# Patient Record
Sex: Female | Born: 1955 | Race: White | Hispanic: No | Marital: Married
Health system: Southern US, Community
[De-identification: ages and names within clinical notes are randomized; demographics above are authoritative.]

## PROBLEM LIST (undated history)

## (undated) DIAGNOSIS — T7840XA Allergy, unspecified, initial encounter: Secondary | ICD-10-CM

## (undated) DIAGNOSIS — F32A Depression, unspecified: Secondary | ICD-10-CM

## (undated) DIAGNOSIS — R011 Cardiac murmur, unspecified: Secondary | ICD-10-CM

## (undated) DIAGNOSIS — G709 Myoneural disorder, unspecified: Secondary | ICD-10-CM

## (undated) DIAGNOSIS — H269 Unspecified cataract: Secondary | ICD-10-CM

## (undated) DIAGNOSIS — F419 Anxiety disorder, unspecified: Secondary | ICD-10-CM

## (undated) DIAGNOSIS — I639 Cerebral infarction, unspecified: Secondary | ICD-10-CM

## (undated) DIAGNOSIS — G4730 Sleep apnea, unspecified: Secondary | ICD-10-CM

## (undated) DIAGNOSIS — E079 Disorder of thyroid, unspecified: Secondary | ICD-10-CM

## (undated) DIAGNOSIS — I10 Essential (primary) hypertension: Secondary | ICD-10-CM

## (undated) DIAGNOSIS — K219 Gastro-esophageal reflux disease without esophagitis: Secondary | ICD-10-CM

## (undated) HISTORY — DX: Essential (primary) hypertension: I10

## (undated) HISTORY — PX: LAMINECTOMY: SHX219

## (undated) HISTORY — DX: Unspecified cataract: H26.9

## (undated) HISTORY — PX: SPINE SURGERY: SHX786

## (undated) HISTORY — PX: EYE SURGERY: SHX253

## (undated) HISTORY — DX: Cardiac murmur, unspecified: R01.1

## (undated) HISTORY — DX: Cerebral infarction, unspecified: I63.9

## (undated) HISTORY — DX: Depression, unspecified: F32.A

## (undated) HISTORY — DX: Allergy, unspecified, initial encounter: T78.40XA

## (undated) HISTORY — DX: Gastro-esophageal reflux disease without esophagitis: K21.9

## (undated) HISTORY — DX: Anxiety disorder, unspecified: F41.9

## (undated) HISTORY — DX: Disorder of thyroid, unspecified: E07.9

## (undated) HISTORY — DX: Sleep apnea, unspecified: G47.30

## (undated) HISTORY — DX: Myoneural disorder, unspecified: G70.9

## (undated) HISTORY — PX: BREAST SURGERY: SHX581

## (undated) HISTORY — PX: TUBAL LIGATION: SHX77

---

## 1999-01-28 ENCOUNTER — Encounter: Admission: RE | Admit: 1999-01-28 | Discharge: 1999-01-28 | Payer: Self-pay | Admitting: Family Medicine

## 1999-01-28 ENCOUNTER — Encounter: Payer: Self-pay | Admitting: Family Medicine

## 1999-02-18 ENCOUNTER — Ambulatory Visit (HOSPITAL_BASED_OUTPATIENT_CLINIC_OR_DEPARTMENT_OTHER): Admission: RE | Admit: 1999-02-18 | Discharge: 1999-02-18 | Payer: Self-pay | Admitting: Surgery

## 1999-02-18 ENCOUNTER — Encounter (INDEPENDENT_AMBULATORY_CARE_PROVIDER_SITE_OTHER): Payer: Self-pay | Admitting: Specialist

## 1999-04-12 ENCOUNTER — Other Ambulatory Visit: Admission: RE | Admit: 1999-04-12 | Discharge: 1999-04-12 | Payer: Self-pay | Admitting: Obstetrics and Gynecology

## 2000-01-30 ENCOUNTER — Encounter: Payer: Self-pay | Admitting: Family Medicine

## 2000-01-30 ENCOUNTER — Encounter: Admission: RE | Admit: 2000-01-30 | Discharge: 2000-01-30 | Payer: Self-pay | Admitting: Family Medicine

## 2000-04-14 ENCOUNTER — Other Ambulatory Visit: Admission: RE | Admit: 2000-04-14 | Discharge: 2000-04-14 | Payer: Self-pay | Admitting: *Deleted

## 2001-06-23 ENCOUNTER — Other Ambulatory Visit: Admission: RE | Admit: 2001-06-23 | Discharge: 2001-06-23 | Payer: Self-pay | Admitting: Obstetrics and Gynecology

## 2001-07-16 ENCOUNTER — Ambulatory Visit (HOSPITAL_COMMUNITY): Admission: RE | Admit: 2001-07-16 | Discharge: 2001-07-16 | Payer: Self-pay | Admitting: *Deleted

## 2002-07-12 ENCOUNTER — Other Ambulatory Visit: Admission: RE | Admit: 2002-07-12 | Discharge: 2002-07-12 | Payer: Self-pay | Admitting: Obstetrics and Gynecology

## 2002-11-23 ENCOUNTER — Other Ambulatory Visit: Admission: RE | Admit: 2002-11-23 | Discharge: 2002-11-23 | Payer: Self-pay | Admitting: Obstetrics & Gynecology

## 2002-12-05 ENCOUNTER — Encounter: Admission: RE | Admit: 2002-12-05 | Discharge: 2002-12-05 | Payer: Self-pay | Admitting: Family Medicine

## 2003-08-29 ENCOUNTER — Encounter: Admission: RE | Admit: 2003-08-29 | Discharge: 2003-08-29 | Payer: Self-pay | Admitting: Obstetrics and Gynecology

## 2004-01-09 ENCOUNTER — Ambulatory Visit: Payer: Self-pay | Admitting: Internal Medicine

## 2004-03-05 ENCOUNTER — Ambulatory Visit: Payer: Self-pay | Admitting: Internal Medicine

## 2005-02-26 ENCOUNTER — Encounter: Admission: RE | Admit: 2005-02-26 | Discharge: 2005-02-26 | Payer: Self-pay | Admitting: Family Medicine

## 2005-03-13 ENCOUNTER — Encounter: Admission: RE | Admit: 2005-03-13 | Discharge: 2005-03-13 | Payer: Self-pay | Admitting: Family Medicine

## 2006-06-01 ENCOUNTER — Encounter: Admission: RE | Admit: 2006-06-01 | Discharge: 2006-06-01 | Payer: Self-pay | Admitting: Family Medicine

## 2007-06-04 ENCOUNTER — Encounter: Admission: RE | Admit: 2007-06-04 | Discharge: 2007-06-04 | Payer: Self-pay | Admitting: Family Medicine

## 2008-07-11 ENCOUNTER — Encounter: Admission: RE | Admit: 2008-07-11 | Discharge: 2008-07-11 | Payer: Self-pay | Admitting: Family Medicine

## 2009-07-17 ENCOUNTER — Encounter: Admission: RE | Admit: 2009-07-17 | Discharge: 2009-07-17 | Payer: Self-pay | Admitting: Family Medicine

## 2009-07-31 ENCOUNTER — Encounter: Admission: RE | Admit: 2009-07-31 | Discharge: 2009-07-31 | Payer: Self-pay | Admitting: Family Medicine

## 2010-06-07 NOTE — Op Note (Signed)
[facility]  Patient:    Dorothy Ray, Dorothy Ray Visit Number: [id number] MRN: [id number]          Service Type: DSU Location: [facility] Attending Physician:  Ermalene Searing Dictated by:   Marina Gravel, M.D. Proc. Date: 07/16/01 Admit Date:  07/16/2001 Discharge Date: 07/16/2001                             Operative Report  PREOPERATIVE DIAGNOSIS:       Undesired fertility.  POSTOPERATIVE DIAGNOSIS:      Undesired fertility.  PROCEDURE:                    Essure tubal sterilization.  SURGEON:                      Marina Gravel, M.D.  ANESTHESIA:                   MAC and 10 cc 0.5% Marcaine paracervical block.  FINDINGS:                     Normal-appearing endometrium.  Normal-appering tubal ostia bilaterally.  After placement, four coils were visual in the cavity on the left and the right side.  FLUID DEFICIT:                40 cc lactated Ringers.  COMPLICATIONS:                None.  ESTIMATED BLOOD LOSS:         Minimal.  INDICATIONS:                  The patient desires permanent sterilization. She has discussed various options and has made the decision to proceed with the Essure tubal sterilization procedure.  The patient has been made aware of the fact that she needs to continue oral contraceptive pills for the following three months until she has her three-month postoperative HST, which is mandated by the FDA to confirm tubal obstruction.  DESCRIPTION OF PROCEDURE:     The patient was taken to the operating room and MAC anesthesia obtained.  The patient was placed in the skin position and examined under anesthesia.  She was found to have a retroverted uterus of normal size.  No adnexal masses or tenderness.  She was prepped and draped in the standard fashion and the bladder was emptied with a red rubber catheter.  A speculum was inserted and paracervical block placed in standard form.  A single-tooth tenaculum was placed on the anterior  lip of the cervix and a 5 mm hysteroscope was inserted after being flushed with LR.  The cavity was distended and both tubal ostia were visualized without difficulty.  The first approach was on the left tube.  It was visualized and the Essure devise passed into the tubal ostia to the appropriate depth over the guidewire.  The device was then released and uncoiled in standard fashion.  Four coils were noted in the cavity after placement.  This procedure was repeated on the opposite side in the exact same fashion.  Four coils were also noted on the right side. This is considered optimal placement.  The instruments were removed.  The cervix was hemostatic.  The patient tolerated the procedure well.  There were no complications.  She was taken to the recovery room awake, alert and in stable condition.  Dictated by:   Marina Gravel, M.D. Attending Physician:  Marina Gravel B DD:  07/16/01 TD:  07/18/01 Job: [id number]

## 2010-06-07 NOTE — H&P (Signed)
[facility]  Patient:    Dorothy Ray, Dorothy Ray Visit Number: [id number] MRN: [id number]          Service Type: Attending:  Marina Gravel, M.D. Dictated by:   Marina Gravel, M.D.                           History and Physical  PREOPERATIVE DIAGNOSIS:       Undesired fertility.  HISTORY OF PRESENT ILLNESS:   A 55 year old white female gravida 2 para 2 who presents with desire for permanent tubal sterilization.  Has considered the various options and wishes to proceed with the Essure tubal sterilization procedure.  She has been made aware of the fact that there is a 10% chance of inability to visualize both tubal ostia, the mandated three-month postoperative HSG, and the need for continuing contraception in the meantime.  PAST MEDICAL HISTORY:         None.  PAST SURGICAL HISTORY:        Left breast mass and L6 laminectomy.  MEDICATIONS:                  Ambien and Xanax p.r.n.  ALLERGIES:                    None.  SOCIAL HISTORY:               One-and-a-half pack a day smoker.  No alcohol or other drugs.  FAMILY HISTORY:               Lung cancer, prostate cancer, and bone cancer in her father, and mother and father with hypertension.  REVIEW OF SYSTEMS:            Otherwise noncontributory.  PHYSICAL EXAMINATION:  VITAL SIGNS:                  Blood pressure 146/88, weight 146, height 5 feet 6 inches.  GENERAL:                      Alert and oriented, no acute distress.  HEART:                        Regular rate and rhythm.  LUNGS:                        Clear to auscultation.  ABDOMEN:                      Liver and spleen normal, no hernia.  PELVIC:                       Normal external genitalia, vagina and cervix normal.  ASSESSMENT:                   Desire for permanent tubal sterilization with the Essure procedure.  Operative risks discussed including infection; bleeding; uterine perforation; damage to bower, bladder, or  surrounding organs; and the other considerations as outlined above.  All questions answered; the patient wishes to proceed. Dictated by:   Marina Gravel, M.D. Attending:  Marina Gravel, M.D. DD:  07/14/01 TD:  07/14/01 Job: [id number]

## 2010-06-07 NOTE — Op Note (Signed)
[place]  Patient:    Dorothy Ray                        MRN: [id number] Proc. Date: 02/18/99 Adm. Date:  [id number] Attending:  Abigail Miyamoto A                           Operative Report  PREOPERATIVE DIAGNOSIS:  Left breast mass.  POSTOPERATIVE DIAGNOSIS:  Left breast mass.  PROCEDURE:  Excision of left breast mass.  SURGEON:  Abigail Miyamoto, M.D.  ANESTHESIA:  Local 1% lidocaine and monitored anesthesia care.  ESTIMATED BLOOD LOSS:  Minimal.  DESCRIPTION OF PROCEDURE:  The patient was brought to the operating room and identified as Dorothy Ray.  She was placed supine on the operating room table, then anesthesia was induced.  The skin overlying the palpable mass was then anesthetized with 1% lidocaine.  A small incision was made in the left breast, overlying the mass with a #15 blade scalpel.  This incision was carried down to the breast tissue with the electrocautery.  The mass was then identified and grasped with an Allis clamp, and then completely excised with the electrocautery.  The ass was then sent to pathology for identification.  The wound was then irrigated with normal saline.  Hemostasis was then achieved with the electrocautery.  The subcutaneous layer was then closed with a single interrupted #3-0 Vicryl suture, and the skin was closed with a running #4-0 monocryl.  Steri-Strips, gauze, and  Tegaderm were then applied.  The patient tolerated the procedure well.  All sponge, needle, and instrument counts were correct at the end of the procedure.  The patient was then taken in  stable condition from the operating room to the recovery room. DD:  02/18/99 TD:  02/18/99 Job: [id number]

## 2011-01-30 ENCOUNTER — Other Ambulatory Visit: Payer: Self-pay | Admitting: Family Medicine

## 2011-01-30 DIAGNOSIS — Z1231 Encounter for screening mammogram for malignant neoplasm of breast: Secondary | ICD-10-CM

## 2011-02-14 ENCOUNTER — Ambulatory Visit: Payer: Self-pay

## 2011-02-25 ENCOUNTER — Inpatient Hospital Stay: Admission: RE | Admit: 2011-02-25 | Payer: Self-pay | Source: Ambulatory Visit

## 2011-05-29 ENCOUNTER — Ambulatory Visit: Payer: Self-pay

## 2011-06-03 ENCOUNTER — Ambulatory Visit
Admission: RE | Admit: 2011-06-03 | Discharge: 2011-06-03 | Disposition: A | Discharge status: Home / Self Care | Payer: Commercial Indemnity | Source: Ambulatory Visit | Attending: Family Medicine | Admitting: Family Medicine

## 2011-06-03 DIAGNOSIS — Z1231 Encounter for screening mammogram for malignant neoplasm of breast: Secondary | ICD-10-CM

## 2011-06-05 ENCOUNTER — Other Ambulatory Visit: Payer: Self-pay | Admitting: Family Medicine

## 2011-06-05 DIAGNOSIS — R928 Other abnormal and inconclusive findings on diagnostic imaging of breast: Secondary | ICD-10-CM

## 2011-06-26 ENCOUNTER — Other Ambulatory Visit: Payer: Commercial Indemnity

## 2011-07-03 ENCOUNTER — Ambulatory Visit
Admission: RE | Admit: 2011-07-03 | Discharge: 2011-07-03 | Disposition: A | Discharge status: Home / Self Care | Payer: Commercial Indemnity | Source: Ambulatory Visit | Attending: Family Medicine | Admitting: Family Medicine

## 2011-07-03 ENCOUNTER — Other Ambulatory Visit: Payer: Self-pay | Admitting: Family Medicine

## 2011-07-03 DIAGNOSIS — R928 Other abnormal and inconclusive findings on diagnostic imaging of breast: Secondary | ICD-10-CM

## 2011-07-03 DIAGNOSIS — N6320 Unspecified lump in the left breast, unspecified quadrant: Secondary | ICD-10-CM

## 2011-07-10 ENCOUNTER — Other Ambulatory Visit: Payer: Commercial Indemnity

## 2011-11-24 ENCOUNTER — Other Ambulatory Visit: Payer: Self-pay | Admitting: Family Medicine

## 2011-11-24 DIAGNOSIS — N6009 Solitary cyst of unspecified breast: Secondary | ICD-10-CM

## 2013-01-23 DIAGNOSIS — I6350 Cerebral infarction due to unspecified occlusion or stenosis of unspecified cerebral artery: Secondary | ICD-10-CM | POA: Insufficient documentation

## 2013-02-04 DIAGNOSIS — H532 Diplopia: Secondary | ICD-10-CM | POA: Insufficient documentation

## 2013-09-06 ENCOUNTER — Other Ambulatory Visit: Payer: Self-pay | Admitting: Family Medicine

## 2013-09-06 DIAGNOSIS — N6002 Solitary cyst of left breast: Secondary | ICD-10-CM

## 2013-10-24 ENCOUNTER — Other Ambulatory Visit: Payer: Self-pay | Admitting: Family Medicine

## 2013-10-24 DIAGNOSIS — N6002 Solitary cyst of left breast: Secondary | ICD-10-CM

## 2013-11-01 ENCOUNTER — Ambulatory Visit
Admission: RE | Admit: 2013-11-01 | Discharge: 2013-11-01 | Disposition: A | Discharge status: Home / Self Care | Payer: Commercial Indemnity | Source: Ambulatory Visit | Attending: Family Medicine | Admitting: Family Medicine

## 2013-11-01 ENCOUNTER — Other Ambulatory Visit: Payer: Self-pay | Admitting: Family Medicine

## 2013-11-01 DIAGNOSIS — N6002 Solitary cyst of left breast: Secondary | ICD-10-CM

## 2013-11-23 ENCOUNTER — Other Ambulatory Visit: Payer: Self-pay | Admitting: Family Medicine

## 2013-11-23 DIAGNOSIS — Z803 Family history of malignant neoplasm of breast: Secondary | ICD-10-CM

## 2013-12-08 ENCOUNTER — Ambulatory Visit
Admission: RE | Admit: 2013-12-08 | Discharge: 2013-12-08 | Disposition: A | Discharge status: Home / Self Care | Payer: Commercial Indemnity | Source: Ambulatory Visit | Attending: Family Medicine | Admitting: Family Medicine

## 2013-12-08 DIAGNOSIS — Z803 Family history of malignant neoplasm of breast: Secondary | ICD-10-CM

## 2013-12-08 MED ORDER — GADOBENATE DIMEGLUMINE 529 MG/ML IV SOLN
20.0000 mL | Freq: Once | INTRAVENOUS | Status: AC | PRN
  Administered 2013-12-08: 20 mL via INTRAVENOUS

## 2013-12-29 DIAGNOSIS — E538 Deficiency of other specified B group vitamins: Secondary | ICD-10-CM | POA: Insufficient documentation

## 2015-01-31 DIAGNOSIS — R5383 Other fatigue: Secondary | ICD-10-CM | POA: Insufficient documentation

## 2015-01-31 DIAGNOSIS — Z0000 Encounter for general adult medical examination without abnormal findings: Secondary | ICD-10-CM | POA: Insufficient documentation

## 2015-01-31 DIAGNOSIS — Z87891 Personal history of nicotine dependence: Secondary | ICD-10-CM | POA: Insufficient documentation

## 2015-01-31 DIAGNOSIS — N951 Menopausal and female climacteric states: Secondary | ICD-10-CM | POA: Insufficient documentation

## 2015-03-19 ENCOUNTER — Other Ambulatory Visit: Payer: Self-pay

## 2015-03-19 DIAGNOSIS — Z1231 Encounter for screening mammogram for malignant neoplasm of breast: Secondary | ICD-10-CM

## 2015-04-05 ENCOUNTER — Ambulatory Visit
Admission: RE | Admit: 2015-04-05 | Discharge: 2015-04-05 | Disposition: A | Discharge status: Home / Self Care | Payer: Commercial Indemnity | Source: Ambulatory Visit

## 2015-04-05 DIAGNOSIS — Z1231 Encounter for screening mammogram for malignant neoplasm of breast: Secondary | ICD-10-CM

## 2015-04-09 ENCOUNTER — Other Ambulatory Visit: Payer: Self-pay | Admitting: Family Medicine

## 2015-04-09 DIAGNOSIS — R928 Other abnormal and inconclusive findings on diagnostic imaging of breast: Secondary | ICD-10-CM

## 2015-04-25 ENCOUNTER — Other Ambulatory Visit: Payer: Self-pay | Admitting: Family Medicine

## 2015-04-25 DIAGNOSIS — R928 Other abnormal and inconclusive findings on diagnostic imaging of breast: Secondary | ICD-10-CM

## 2015-05-17 ENCOUNTER — Other Ambulatory Visit: Payer: Commercial Indemnity

## 2015-06-05 ENCOUNTER — Ambulatory Visit
Admission: RE | Admit: 2015-06-05 | Discharge: 2015-06-05 | Disposition: A | Discharge status: Home / Self Care | Payer: Commercial Indemnity | Source: Ambulatory Visit | Attending: Family Medicine | Admitting: Family Medicine

## 2015-06-05 ENCOUNTER — Other Ambulatory Visit: Payer: Self-pay | Admitting: Family Medicine

## 2015-06-05 DIAGNOSIS — R928 Other abnormal and inconclusive findings on diagnostic imaging of breast: Secondary | ICD-10-CM

## 2015-07-13 ENCOUNTER — Other Ambulatory Visit: Payer: Commercial Indemnity

## 2015-07-26 ENCOUNTER — Other Ambulatory Visit: Payer: Self-pay | Admitting: Family Medicine

## 2015-07-26 ENCOUNTER — Ambulatory Visit
Admission: RE | Admit: 2015-07-26 | Discharge: 2015-07-26 | Disposition: A | Discharge status: Home / Self Care | Payer: Commercial Indemnity | Source: Ambulatory Visit | Attending: Family Medicine | Admitting: Family Medicine

## 2015-07-26 DIAGNOSIS — R928 Other abnormal and inconclusive findings on diagnostic imaging of breast: Secondary | ICD-10-CM

## 2015-07-26 DIAGNOSIS — R0602 Shortness of breath: Secondary | ICD-10-CM

## 2015-07-26 MED ORDER — GADOBENATE DIMEGLUMINE 529 MG/ML IV SOLN
15.0000 mL | Freq: Once | INTRAVENOUS | Status: AC | PRN
  Administered 2015-07-26: 15 mL via INTRAVENOUS

## 2015-07-31 ENCOUNTER — Ambulatory Visit
Admission: RE | Admit: 2015-07-31 | Discharge: 2015-07-31 | Disposition: A | Discharge status: Home / Self Care | Payer: Commercial Indemnity | Source: Ambulatory Visit | Attending: Family Medicine | Admitting: Family Medicine

## 2015-07-31 ENCOUNTER — Other Ambulatory Visit: Payer: Self-pay | Admitting: Family Medicine

## 2015-07-31 DIAGNOSIS — R928 Other abnormal and inconclusive findings on diagnostic imaging of breast: Secondary | ICD-10-CM

## 2016-06-11 ENCOUNTER — Other Ambulatory Visit: Payer: Self-pay | Admitting: Family Medicine

## 2016-06-11 DIAGNOSIS — R921 Mammographic calcification found on diagnostic imaging of breast: Secondary | ICD-10-CM

## 2016-08-05 ENCOUNTER — Ambulatory Visit
Admission: RE | Admit: 2016-08-05 | Discharge: 2016-08-05 | Disposition: A | Discharge status: Home / Self Care | Payer: 59 | Source: Ambulatory Visit | Attending: Family Medicine | Admitting: Family Medicine

## 2016-08-05 DIAGNOSIS — R921 Mammographic calcification found on diagnostic imaging of breast: Secondary | ICD-10-CM

## 2017-07-28 ENCOUNTER — Other Ambulatory Visit: Payer: Self-pay | Admitting: Family Medicine

## 2017-07-28 DIAGNOSIS — Z1231 Encounter for screening mammogram for malignant neoplasm of breast: Secondary | ICD-10-CM

## 2017-08-25 ENCOUNTER — Ambulatory Visit: Payer: 59

## 2017-08-25 ENCOUNTER — Ambulatory Visit
Admission: RE | Admit: 2017-08-25 | Discharge: 2017-08-25 | Disposition: A | Discharge status: Home / Self Care | Payer: 59 | Source: Ambulatory Visit | Attending: Family Medicine | Admitting: Family Medicine

## 2017-08-25 DIAGNOSIS — Z1231 Encounter for screening mammogram for malignant neoplasm of breast: Secondary | ICD-10-CM

## 2018-06-28 ENCOUNTER — Other Ambulatory Visit: Payer: Self-pay | Admitting: Family Medicine

## 2018-06-28 DIAGNOSIS — Z1231 Encounter for screening mammogram for malignant neoplasm of breast: Secondary | ICD-10-CM

## 2018-08-30 ENCOUNTER — Other Ambulatory Visit: Payer: Self-pay

## 2018-08-30 ENCOUNTER — Ambulatory Visit
Admission: RE | Admit: 2018-08-30 | Discharge: 2018-08-30 | Disposition: A | Discharge status: Home / Self Care | Payer: Managed Care, Other (non HMO) | Source: Ambulatory Visit | Attending: Family Medicine | Admitting: Family Medicine

## 2018-08-30 DIAGNOSIS — Z1231 Encounter for screening mammogram for malignant neoplasm of breast: Secondary | ICD-10-CM

## 2018-09-01 ENCOUNTER — Other Ambulatory Visit: Payer: Self-pay | Admitting: Family Medicine

## 2018-09-01 DIAGNOSIS — R928 Other abnormal and inconclusive findings on diagnostic imaging of breast: Secondary | ICD-10-CM

## 2018-09-08 ENCOUNTER — Other Ambulatory Visit: Payer: Self-pay

## 2018-09-08 ENCOUNTER — Ambulatory Visit
Admission: RE | Admit: 2018-09-08 | Discharge: 2018-09-08 | Disposition: A | Discharge status: Home / Self Care | Payer: Managed Care, Other (non HMO) | Source: Ambulatory Visit | Attending: Family Medicine | Admitting: Family Medicine

## 2018-09-08 DIAGNOSIS — R928 Other abnormal and inconclusive findings on diagnostic imaging of breast: Secondary | ICD-10-CM

## 2018-10-08 DIAGNOSIS — H0234 Blepharochalasis left upper eyelid: Secondary | ICD-10-CM | POA: Insufficient documentation

## 2018-10-08 DIAGNOSIS — H0231 Blepharochalasis right upper eyelid: Secondary | ICD-10-CM | POA: Insufficient documentation

## 2019-09-07 ENCOUNTER — Other Ambulatory Visit: Payer: Self-pay | Admitting: Family Medicine

## 2019-09-07 DIAGNOSIS — Z0000 Encounter for general adult medical examination without abnormal findings: Secondary | ICD-10-CM

## 2019-12-07 ENCOUNTER — Other Ambulatory Visit: Payer: Self-pay

## 2019-12-07 ENCOUNTER — Ambulatory Visit
Admission: RE | Admit: 2019-12-07 | Discharge: 2019-12-07 | Disposition: A | Discharge status: Home / Self Care | Payer: Managed Care, Other (non HMO) | Source: Ambulatory Visit | Attending: Family Medicine | Admitting: Family Medicine

## 2019-12-07 DIAGNOSIS — Z0000 Encounter for general adult medical examination without abnormal findings: Secondary | ICD-10-CM

## 2020-11-15 ENCOUNTER — Other Ambulatory Visit: Payer: Self-pay | Admitting: Family Medicine

## 2020-11-15 DIAGNOSIS — Z1231 Encounter for screening mammogram for malignant neoplasm of breast: Secondary | ICD-10-CM

## 2020-12-31 ENCOUNTER — Ambulatory Visit
Admission: RE | Admit: 2020-12-31 | Discharge: 2020-12-31 | Disposition: A | Discharge status: Home / Self Care | Payer: Managed Care, Other (non HMO) | Source: Ambulatory Visit | Attending: Family Medicine | Admitting: Family Medicine

## 2020-12-31 DIAGNOSIS — Z1231 Encounter for screening mammogram for malignant neoplasm of breast: Secondary | ICD-10-CM

## 2021-11-26 ENCOUNTER — Other Ambulatory Visit: Payer: Self-pay | Admitting: Family Medicine

## 2021-11-26 DIAGNOSIS — Z1231 Encounter for screening mammogram for malignant neoplasm of breast: Secondary | ICD-10-CM

## 2022-01-02 ENCOUNTER — Ambulatory Visit
Admission: RE | Admit: 2022-01-02 | Discharge: 2022-01-02 | Disposition: A | Discharge status: Home / Self Care | Payer: Managed Care, Other (non HMO) | Source: Ambulatory Visit | Attending: Family Medicine | Admitting: Family Medicine

## 2022-01-02 ENCOUNTER — Other Ambulatory Visit: Payer: Self-pay | Admitting: Family Medicine

## 2022-01-02 DIAGNOSIS — Z1231 Encounter for screening mammogram for malignant neoplasm of breast: Secondary | ICD-10-CM

## 2022-01-06 ENCOUNTER — Other Ambulatory Visit: Payer: Self-pay | Admitting: Family Medicine

## 2022-01-06 DIAGNOSIS — Z1231 Encounter for screening mammogram for malignant neoplasm of breast: Secondary | ICD-10-CM

## 2022-03-13 IMAGING — MG MM DIGITAL SCREENING BILAT W/ TOMO AND CAD
6 of 10 series · 6 of 30 positions shown · non-contrast
Comparison: Previous exam(s).

CLINICAL DATA: Screening.

EXAM:
DIGITAL SCREENING BILATERAL MAMMOGRAM WITH TOMOSYNTHESIS AND CAD
TECHNIQUE: Bilateral screening digital craniocaudal and mediolateral oblique
mammograms were obtained. Bilateral screening digital breast
tomosynthesis was performed. The images were evaluated with
computer-aided detection.

[R XCCL synth-2D]
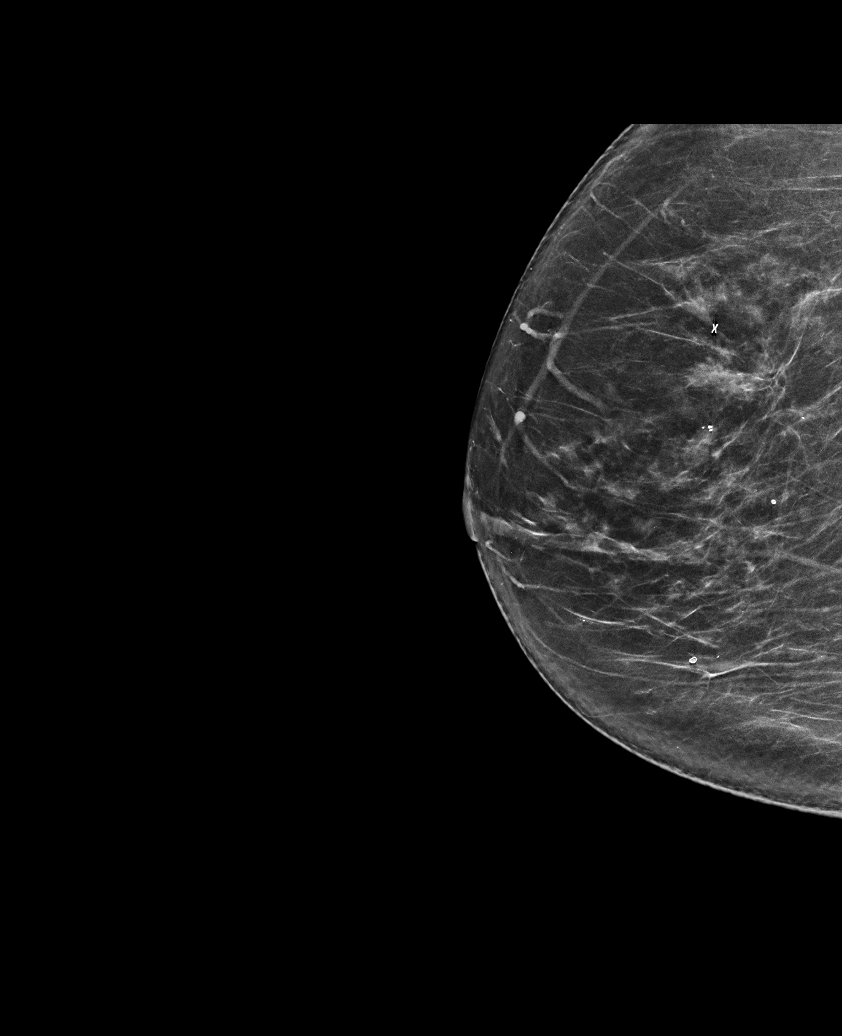

[L MLO synth-2D]
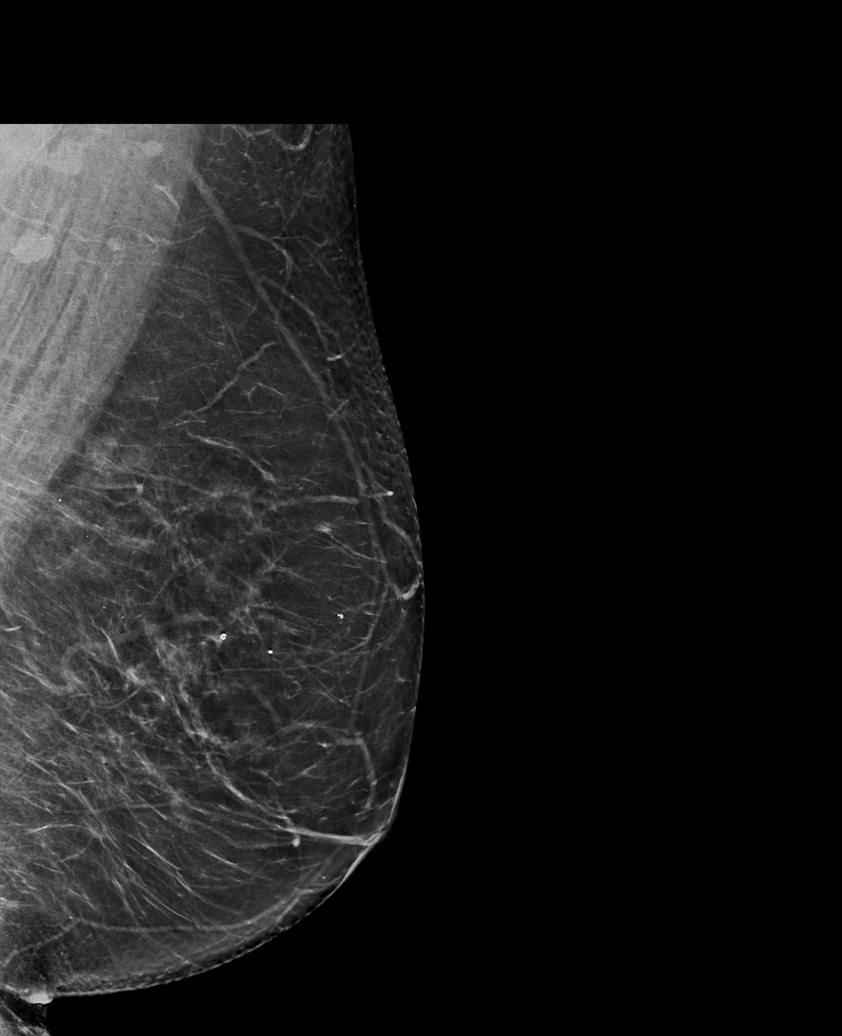

[R CC synth-2D]
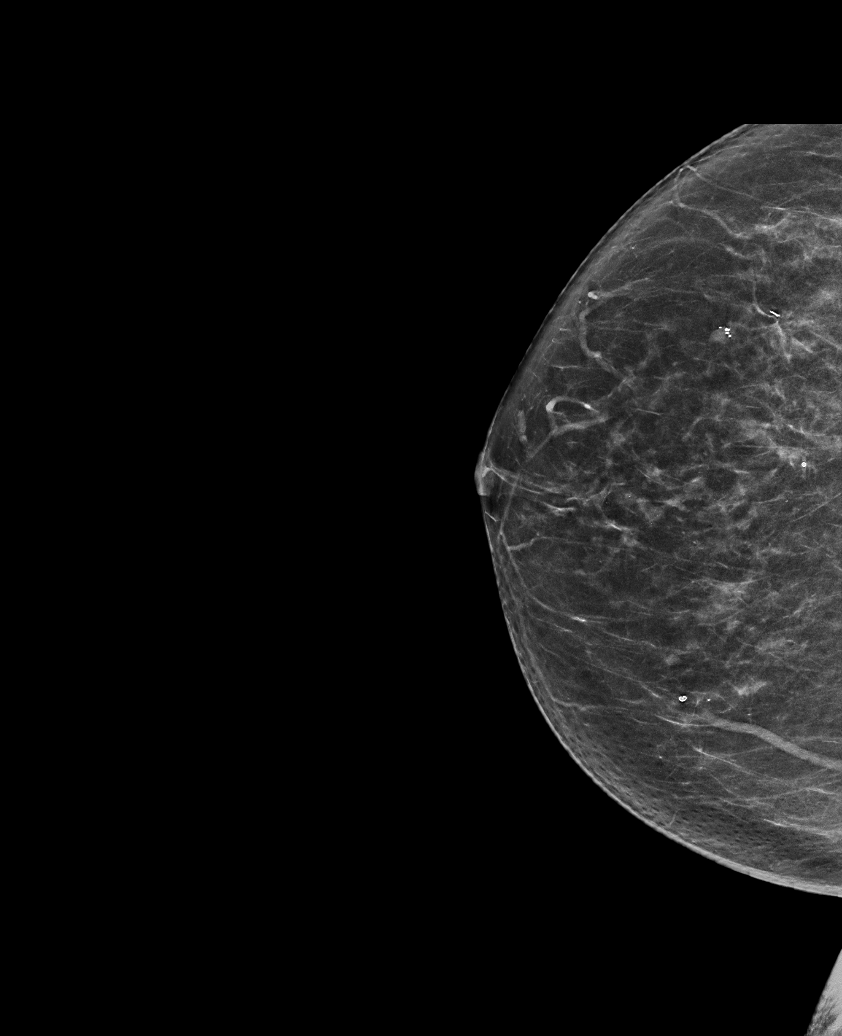

[L CC synth-2D]
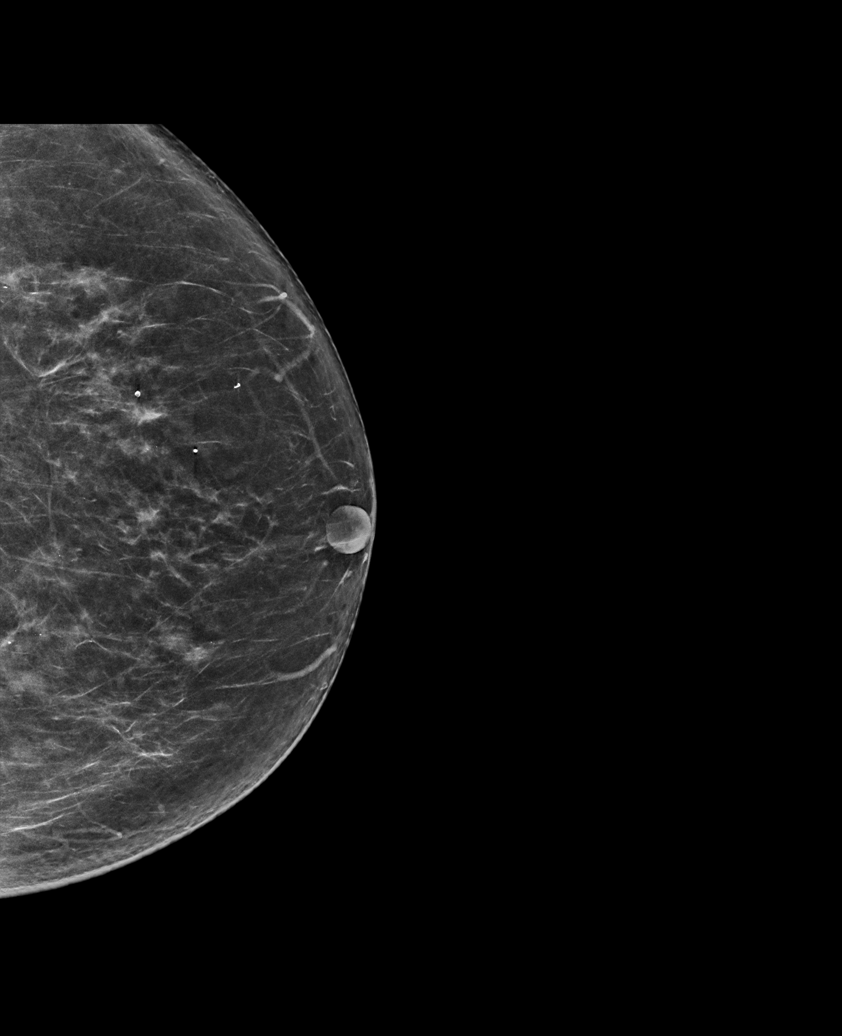

[R MLO synth-2D]
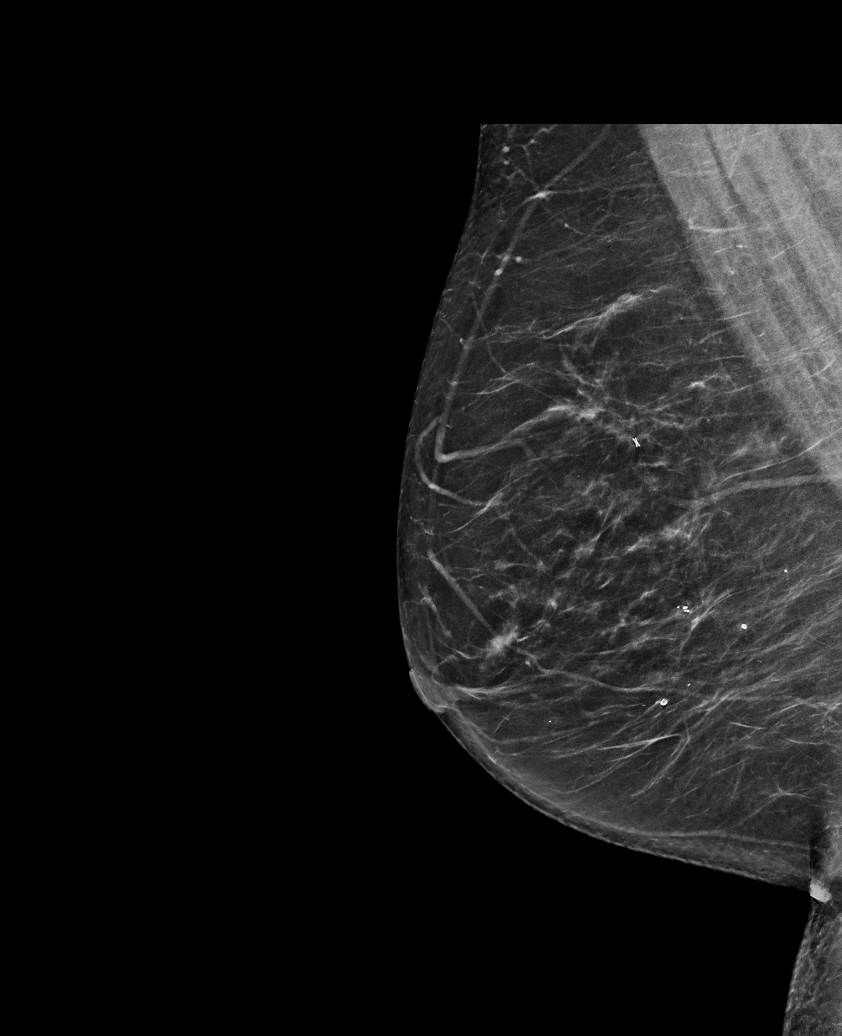

[R CC tomo · tomo slice 33/66.0]
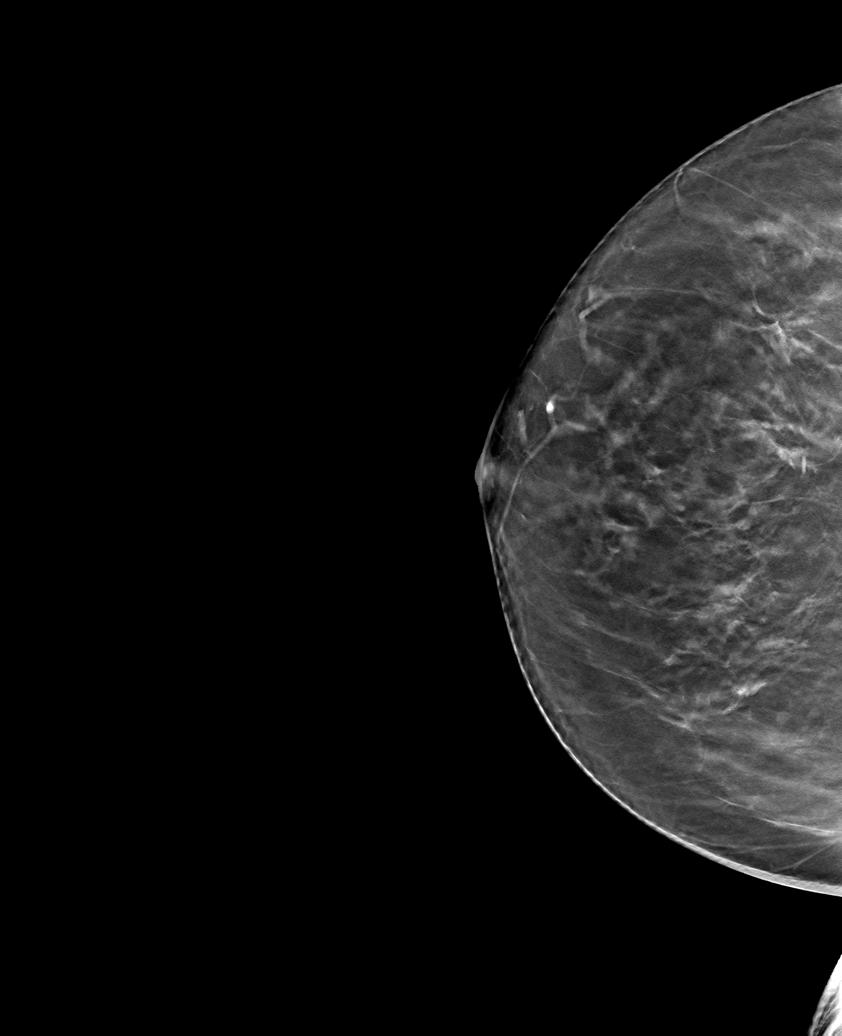

[6 of 30 positions shown; findings below may reference images not displayed]

ACR Breast Density Category b: There are scattered areas of
fibroglandular density.
FINDINGS: There are no findings suspicious for malignancy.
IMPRESSION: No mammographic evidence of malignancy. A result letter of this
screening mammogram will be mailed directly to the patient.

RECOMMENDATION:
Screening mammogram in one year. (Code:51-O-LD2)

BI-RADS CATEGORY  1: Negative.

## 2023-01-06 ENCOUNTER — Inpatient Hospital Stay
Admission: RE | Admit: 2023-01-06 | Discharge: 2023-01-06 | Disposition: A | Discharge status: Home / Self Care | Payer: Managed Care, Other (non HMO) | Source: Ambulatory Visit | Attending: Family Medicine | Admitting: Family Medicine

## 2023-01-06 DIAGNOSIS — Z1231 Encounter for screening mammogram for malignant neoplasm of breast: Secondary | ICD-10-CM

## 2023-09-16 ENCOUNTER — Telehealth: Payer: Self-pay | Admitting: Urgent Care

## 2023-09-16 DIAGNOSIS — Z0000 Encounter for general adult medical examination without abnormal findings: Secondary | ICD-10-CM

## 2023-09-16 NOTE — Telephone Encounter (Signed)
 Patient is requesting labs before appointment on October 7th 2025 she would like to get results before her appointment please advise

## 2023-10-27 ENCOUNTER — Ambulatory Visit (INDEPENDENT_AMBULATORY_CARE_PROVIDER_SITE_OTHER): Admitting: Urgent Care

## 2023-10-27 ENCOUNTER — Encounter: Payer: Self-pay | Admitting: Urgent Care

## 2023-10-27 VITALS — BP 151/71 | HR 53 | Wt 170.0 lb

## 2023-10-27 DIAGNOSIS — Z8673 Personal history of transient ischemic attack (TIA), and cerebral infarction without residual deficits: Secondary | ICD-10-CM | POA: Diagnosis not present

## 2023-10-27 DIAGNOSIS — E559 Vitamin D deficiency, unspecified: Secondary | ICD-10-CM

## 2023-10-27 DIAGNOSIS — I10 Essential (primary) hypertension: Secondary | ICD-10-CM | POA: Diagnosis not present

## 2023-10-27 DIAGNOSIS — E039 Hypothyroidism, unspecified: Secondary | ICD-10-CM | POA: Insufficient documentation

## 2023-10-27 DIAGNOSIS — H9193 Unspecified hearing loss, bilateral: Secondary | ICD-10-CM

## 2023-10-27 DIAGNOSIS — I340 Nonrheumatic mitral (valve) insufficiency: Secondary | ICD-10-CM | POA: Insufficient documentation

## 2023-10-27 DIAGNOSIS — G4733 Obstructive sleep apnea (adult) (pediatric): Secondary | ICD-10-CM

## 2023-10-27 DIAGNOSIS — Z79899 Other long term (current) drug therapy: Secondary | ICD-10-CM

## 2023-10-27 DIAGNOSIS — G4709 Other insomnia: Secondary | ICD-10-CM

## 2023-10-27 DIAGNOSIS — R7989 Other specified abnormal findings of blood chemistry: Secondary | ICD-10-CM

## 2023-10-27 MED ORDER — DOXEPIN HCL 3 MG PO TABS: 3.0000 mg | ORAL_TABLET | Freq: Every evening | ORAL | 1 refills | Status: DC

## 2023-10-27 NOTE — Patient Instructions (Signed)
 Stop your night time dose of xanax. Instead, start silenor. Take 3mg  nightly. If in one week you need, you can increase to 6mg .  Continue all your home meds as ordered. Send me home BP readings via Mychart in one week   Schedule your DEXA scan in Dec.  Schedule your annual PE in 3 months.

## 2023-10-27 NOTE — Progress Notes (Signed)
 New Patient Office Visit  Subjective:  Patient ID: Dorothy Ray, female    DOB: [birth year]  Age: 68 y.o. MRN: [id number]  CC:  Chief Complaint  Patient presents with   Establish Care    fasting    HPI BLESS Ray presents to establish care.   Discussed the use of AI scribe software for clinical note transcription with the patient, who gave verbal consent to proceed.   She has a history of hypertension and previously took amlodipine with olmesartan. After losing weight on Mounjaro, she became hypotensive and discontinued amlodipine. Her blood pressure in office is now slightly elevated, but she monitors it at home and reports it is not significantly elevated. She is currently taking olmesartan for blood pressure management. Home readings are around 117/70. Pt admits to being anxious about new pt office visit.  She has a history of cerebrovascular accident (CVA). Left PICA ischemic stroke in 2015 Does not appear to be from ASCVD of carotid or vertebral arteries based on Doppler imaging findings. Never evaluated for presence of A-fib or PFO in the past. Echocardiogram shows normal LV/RV systolic function with negative bubble study. She is to continue aspirin 81 mg p.o. once daily  She has hypothyroidism and is on Synthroid. She also takes biotin, vitamin D, Ambien, Xanax, and melatonin.  She experiences poor sleep quality, waking up 6-8 times a night for up to 30 minutes, and does not feel rested. She was diagnosed with obstructive sleep apnea (OSA) and previously used a CPAP, but stopped due to mask fitting issues. She is attempting to reuse the CPAP. She has been taking melatonin, ambien and xanax to sleep.  She experiences increased stress and anxiety, exacerbated by recent life events including the death of her husband and dog, and her sister's passing. She manages stress with biking, music, and Xanax, which she takes situationally.  She is a former smoker, having smoked  three-fourths of a pack daily from age 23 to 40. She drinks alcohol occasionally, about three shots once a month.  She exercises regularly, enjoys biking and going to the gym, but reports a lack of appetite, typically consuming one good meal a day supplemented with protein shakes and snacks.     She does have hx of Heart murmur: Mild mitral regurgitation on echocardiogram, per her cardiologist. Repeat echocardiogram in 3 to 5 years (roughly 2027 timeframe)   Outpatient Encounter Medications as of 10/27/2023  Medication Sig   Biotin 1000 MCG tablet Take 1,000 mcg by mouth.   Black Cohosh (RA BLACK COHOSH) 200 MG CAPS Take 200 mg by mouth.   clopidogrel (PLAVIX) 75 MG tablet Take 1 tablet by mouth daily.   Coenzyme Q10 50 MG CAPS Take by mouth.   cyanocobalamin (VITAMIN B12) 1000 MCG/ML injection Inject 1,000 mcg into the muscle.   cyclobenzaprine (FLEXERIL) 10 MG tablet Take 10 mg by mouth.   Doxepin HCl 3 MG TABS Take 1 tablet (3 mg total) by mouth at bedtime.   Omega-3 Fatty Acids (FISH OIL) 1000 MG CAPS Take by mouth.   omeprazole (PRILOSEC) 40 MG capsule Take 1 capsule by mouth daily.   tretinoin (RETIN-A) 0.025 % gel Apply 0.05 % topically.   triamcinolone cream (KENALOG) 0.1 % Apply topically.   zinc sulfate, 50mg  elemental zinc, 220 (50 Zn) MG capsule Take 220 mg by mouth.   Cholecalciferol 125 MCG (5000 UT) capsule Take 5,000 Units by mouth.   levothyroxine (SYNTHROID) 50 MCG tablet Take 50 mcg by  mouth daily.   Multiple Vitamins-Minerals (PRESERVISION AREDS PO)    olmesartan (BENICAR) 40 MG tablet Take 40 mg by mouth daily.   zolpidem (AMBIEN) 10 MG tablet Take 10 mg by mouth at bedtime as needed.   No facility-administered encounter medications on file as of 10/27/2023.    History reviewed. No pertinent past medical history.  Past Surgical History:  Procedure Laterality Date   LAMINECTOMY      Family History  Problem Relation Age of Onset   Breast cancer Sister         breast cancer twice     Social History   Socioeconomic History   Marital status: Married    Spouse name: Not on file   Number of children: Not on file   Years of education: Not on file   Highest education level: Bachelor's degree (e.g., BA, AB, BS)  Occupational History   Not on file  Tobacco Use   Smoking status: Former    Types: Cigarettes   Smokeless tobacco: Never   Tobacco comments:    Smoked for 33years 3/4 pack  Substance and Sexual Activity   Alcohol use: Yes    Comment: occasionally   Drug use: Not on file   Sexual activity: Not on file  Other Topics Concern   Not on file  Social History Narrative   Not on file   Social Drivers of Health   Financial Resource Strain: Low Risk  (10/25/2023)   Overall Financial Resource Strain (CARDIA)    Difficulty of Paying Living Expenses: Not hard at all  Food Insecurity: No Food Insecurity (10/25/2023)   Hunger Vital Sign    Worried About Running Out of Food in the Last Year: Never true    Ran Out of Food in the Last Year: Never true  Transportation Needs: No Transportation Needs (10/25/2023)   PRAPARE - Administrator, Civil Service (Medical): No    Lack of Transportation (Non-Medical): No  Physical Activity: Unknown (10/25/2023)   Exercise Vital Sign    Days of Exercise per Week: 3 days    Minutes of Exercise per Session: Not on file  Stress: Stress Concern Present (10/25/2023)   [employer] of Occupational Health - Occupational Stress Questionnaire    Feeling of Stress: Rather much  Social Connections: Socially Integrated (10/25/2023)   Social Connection and Isolation Panel    Frequency of Communication with Friends and Family: More than three times a week    Frequency of Social Gatherings with Friends and Family: Three times a week    Attends Religious Services: More than 4 times per year    Active Member of Clubs or Organizations: Yes    Attends Banker Meetings: More than 4 times per year     Marital Status: Married  Catering manager Violence: Unknown (04/24/2021)   Received from [facility]   HITS    Physically Hurt: Not on file    Insult or Talk Down To: Not on file    Threaten Physical Harm: Not on file    Scream or Curse: Not on file    ROS: as noted in HPI  Objective:  BP (!) 151/71   Pulse (!) 53   Wt 170 lb (77.1 kg)   SpO2 99%   Physical Exam Vitals and nursing note reviewed. Exam conducted with a chaperone present.  Constitutional:      General: She is not in acute distress.    Appearance: Normal appearance. She is not  ill-appearing, toxic-appearing or diaphoretic.  HENT:     Head: Normocephalic and atraumatic.     Right Ear: Tympanic membrane, ear canal and external ear normal. There is no impacted cerumen.     Left Ear: Tympanic membrane, ear canal and external ear normal. There is no impacted cerumen.     Ears:     Comments: Hearing aids B    Nose: Nose normal.     Mouth/Throat:     Mouth: Mucous membranes are moist.     Pharynx: Oropharynx is clear. No oropharyngeal exudate or posterior oropharyngeal erythema.  Eyes:     General: No scleral icterus.       Right eye: No discharge.        Left eye: No discharge.     Extraocular Movements: Extraocular movements intact.  Neck:     Thyroid: No thyroid mass, thyromegaly or thyroid tenderness.  Cardiovascular:     Rate and Rhythm: Normal rate and regular rhythm.     Pulses: Normal pulses.     Heart sounds: Murmur heard.  Pulmonary:     Effort: Pulmonary effort is normal. No respiratory distress.     Breath sounds: Normal breath sounds. No stridor. No wheezing or rhonchi.  Musculoskeletal:     Cervical back: Normal range of motion and neck supple. No rigidity or tenderness.  Lymphadenopathy:     Cervical: No cervical adenopathy.  Skin:    General: Skin is warm and dry.     Coloration: Skin is not jaundiced.     Findings: No bruising, erythema or rash.  Neurological:     General: No focal  deficit present.     Mental Status: She is alert and oriented to person, place, and time.  Psychiatric:        Mood and Affect: Mood normal.        Behavior: Behavior normal.      Assessment & Plan:  Primary hypertension -     CBC with Differential/Platelet -     Hemoglobin A1c -     TSH -     Lipid panel -     Comprehensive metabolic panel with GFR -     T4, free  History of CVA (cerebrovascular accident) -     CBC with Differential/Platelet -     Hemoglobin A1c -     TSH -     Lipid panel -     Comprehensive metabolic panel with GFR  Mitral valve insufficiency, unspecified etiology  Hypothyroidism, unspecified type -     TSH -     T4, free  Elevated serum creatinine -     Comprehensive metabolic panel with GFR  Abnormal CBC -     CBC with Differential/Platelet  Bilateral hearing loss, unspecified hearing loss type  Other insomnia -     Doxepin HCl; Take 1 tablet (3 mg total) by mouth at bedtime.  Dispense: 90 tablet; Refill: 1 -     Ambulatory referral to Sleep Studies  Vitamin D deficiency -     VITAMIN D 25 Hydroxy (Vit-D Deficiency, Fractures)  Long-term use of high-risk medication -     B12 and Folate Panel  OSA on CPAP -     Ambulatory referral to Sleep Studies  Assessment and Plan    Will draw labs today for baseline. Stop night time xanax, will switch to doxepin instead which may also help to some degree with stress Will refer for in office sleep medicine consult given  her insomnia and 6-8 night time awakenings. She had home sleep test in the past, needs further workup. Will have patient return in 3 months, sooner if blood pressure readings remain elevated.       Return in about 3 months (around 01/27/2024).   Benton LITTIE Gave, PA

## 2023-10-28 ENCOUNTER — Ambulatory Visit: Payer: Self-pay | Admitting: Urgent Care

## 2023-10-28 DIAGNOSIS — G4709 Other insomnia: Secondary | ICD-10-CM

## 2023-10-28 LAB — LIPID PANEL
Chol/HDL Ratio: 3.4 ratio (ref 0.0–4.4)
Cholesterol, Total: 203 mg/dL — ABNORMAL HIGH (ref 100–199)
HDL: 60 mg/dL (ref >39)
LDL Chol Calc (NIH): 121 mg/dL — ABNORMAL HIGH (ref 0–99)
Triglycerides: 126 mg/dL (ref 0–149)
VLDL Cholesterol Cal: 22 mg/dL (ref 5–40)

## 2023-10-28 LAB — CBC WITH DIFFERENTIAL/PLATELET
Basophils Absolute: 0.1 x10E3/uL (ref 0.0–0.2)
Basos: 1 %
EOS (ABSOLUTE): 0.1 x10E3/uL (ref 0.0–0.4)
Eos: 1 %
Hematocrit: 40.5 % (ref 34.0–46.6)
Hemoglobin: 13.6 g/dL (ref 11.1–15.9)
Immature Grans (Abs): 0 x10E3/uL (ref 0.0–0.1)
Immature Granulocytes: 0 %
Lymphocytes Absolute: 1.5 x10E3/uL (ref 0.7–3.1)
Lymphs: 25 %
MCH: 31.4 pg (ref 26.6–33.0)
MCHC: 33.6 g/dL (ref 31.5–35.7)
MCV: 94 fL (ref 79–97)
Monocytes Absolute: 0.4 x10E3/uL (ref 0.1–0.9)
Monocytes: 7 %
Neutrophils Absolute: 4 x10E3/uL (ref 1.4–7.0)
Neutrophils: 66 %
Platelets: 246 x10E3/uL (ref 150–450)
RBC: 4.33 x10E6/uL (ref 3.77–5.28)
RDW: 12.4 % (ref 11.7–15.4)
WBC: 6.1 x10E3/uL (ref 3.4–10.8)

## 2023-10-28 LAB — COMPREHENSIVE METABOLIC PANEL WITH GFR
ALT: 14 IU/L (ref 0–32)
AST: 19 IU/L (ref 0–40)
Albumin: 4.4 g/dL (ref 3.9–4.9)
Alkaline Phosphatase: 93 IU/L (ref 49–135)
BUN/Creatinine Ratio: 15 (ref 12–28)
BUN: 12 mg/dL (ref 8–27)
Bilirubin Total: 0.4 mg/dL (ref 0.0–1.2)
CO2: 22 mmol/L (ref 20–29)
Calcium: 9.2 mg/dL (ref 8.7–10.3)
Chloride: 98 mmol/L (ref 96–106)
Creatinine, Ser: 0.81 mg/dL (ref 0.57–1.00)
Globulin, Total: 2 g/dL (ref 1.5–4.5)
Glucose: 94 mg/dL (ref 70–99)
Potassium: 4.4 mmol/L (ref 3.5–5.2)
Sodium: 137 mmol/L (ref 134–144)
Total Protein: 6.4 g/dL (ref 6.0–8.5)
eGFR: 80 mL/min/1.73 (ref >59)

## 2023-10-28 LAB — HEMOGLOBIN A1C
Est. average glucose Bld gHb Est-mCnc: 123 mg/dL
Hgb A1c MFr Bld: 5.9 % — ABNORMAL HIGH (ref 4.8–5.6)

## 2023-10-28 LAB — T4, FREE: Free T4: 1.33 ng/dL (ref 0.82–1.77)

## 2023-10-28 LAB — VITAMIN D 25 HYDROXY (VIT D DEFICIENCY, FRACTURES): Vit D, 25-Hydroxy: 42.9 ng/mL (ref 30.0–100.0)

## 2023-10-28 LAB — B12 AND FOLATE PANEL
Folate: 12.7 ng/mL (ref >3.0)
Vitamin B-12: 394 pg/mL (ref 232–1245)

## 2023-10-28 LAB — TSH: TSH: 2.33 u[IU]/mL (ref 0.450–4.500)

## 2023-10-29 ENCOUNTER — Telehealth: Payer: Self-pay

## 2023-10-29 ENCOUNTER — Other Ambulatory Visit: Payer: Self-pay | Admitting: Urgent Care

## 2023-10-29 DIAGNOSIS — Z1231 Encounter for screening mammogram for malignant neoplasm of breast: Secondary | ICD-10-CM

## 2023-10-29 DIAGNOSIS — Z13820 Encounter for screening for osteoporosis: Secondary | ICD-10-CM

## 2023-10-29 NOTE — Telephone Encounter (Signed)
 Copied from CRM #[id number]. Topic: Clinical - Request for Lab/Test Order >> Oct 29, 2023 10:36 AM Shanda MATSU wrote: Reason for CRM: Patient calling in req that order for dexa scan be placed, patient stated that PCP adv her that she needed one done, however, when patient attempted to schedule she was adv order has not been placed, patient is req a call back to confirm order has been placed in her chart.

## 2023-11-04 NOTE — Telephone Encounter (Signed)
 Patient in meeting at time of attempted call. Could not discuss and could not leave return number. Patient requesting a return call at later time.

## 2023-11-05 ENCOUNTER — Encounter: Payer: Self-pay | Admitting: Urgent Care

## 2023-11-05 ENCOUNTER — Ambulatory Visit (INDEPENDENT_AMBULATORY_CARE_PROVIDER_SITE_OTHER): Admitting: Urgent Care

## 2023-11-05 VITALS — BP 143/70 | HR 72 | Wt 170.0 lb

## 2023-11-05 DIAGNOSIS — Z0000 Encounter for general adult medical examination without abnormal findings: Secondary | ICD-10-CM | POA: Diagnosis not present

## 2023-11-05 DIAGNOSIS — I10 Essential (primary) hypertension: Secondary | ICD-10-CM

## 2023-11-05 NOTE — Patient Instructions (Addendum)
  Dorothy Ray , Thank you for taking time to come for your Medicare Wellness Visit. I appreciate your ongoing commitment to your health goals. Please review the following plan we discussed and let me know if I can assist you in the future.   These are the goals we discussed:  Goals      Achieve a Healthy Weight-Obesity     Timeframe:  Long-Range Goal Priority:  High Start Date:                             Expected End Date:                         - set goal weight    Why is this important?   When you are ready to manage your weight, have a plan and have set a goal, it is time to take action.  Taking small steps to change how you eat and exercise is a good place to start.    Notes:  GOAL WEIGHT 155#=     Activity and Exercise Increased     Evidence-based guidance:  Review current exercise levels.  Assess patient perspective on exercise or activity level, barriers to increasing activity, motivation and readiness for change.  Recommend or set healthy exercise goal based on individual tolerance.  Encourage small steps toward making change in amount of exercise or activity.  Urge reduction of sedentary activities or screen time.  Promote group activities within the community or with family or support person.  Consider referral to rehabiliation therapist for assessment and exercise/activity plan.   Notes:      Blood Pressure < 140/90     Maintain normal BP     Safe Sleep Environment Maintained     Evidence-based guidance:   Provide anticipatory guidance regarding risks of cosleeping and sudden unexplained infant death.  Encourage use of safe sleep practices including always placing the baby on back in own crib, bassinette or cradle in the parents' room.  Keep the crib or bassinette free of pillows, bumper pads, stuffed toys and blankets.  Promote alternatives to cosleeping such as room-sharing or cosleeping bassinettes that attach to the parents' bed.  Encourage pacifier use at  sleep times after breastfeeding is firmly established or soon after birth in bottle-fed infants.    Notes:  Pt would like to achieve good, sound sleep all night        This is a list of the screening recommended for you and due dates:  Health Maintenance  Topic Date Due   Medicare Annual Wellness Visit  Never done   COVID-19 Vaccine (1) Never done   Hepatitis C Screening  Never done   DTaP/Tdap/Td vaccine (1 - Tdap) Never done   Pneumococcal Vaccine for age over 28 (1 of 2 - PCV) Never done   Zoster (Shingles) Vaccine (1 of 2) Never done   DEXA scan (bone density measurement)  Never done   Flu Shot  08/21/2023   Breast Cancer Screening  01/05/2025   Colon Cancer Screening  12/25/2029   Meningitis B Vaccine  Aged [facility] [address] [place] [address] [phone]

## 2023-11-05 NOTE — Progress Notes (Signed)
 Annual Wellness Visit     Patient: Dorothy Ray, Female    DOB: Mar 17, 1955, 68 y.o.   MRN: [id number]  Subjective  Chief Complaint  Patient presents with   Annual Exam    Welcome to medicare    Dorothy Ray is a 68 y.o. female who presents today for her Annual Wellness Visit. She reports consuming a general diet. Exercise includes walking and biking. She generally feels well. She reports sleeping poorly. She does have additional problems to discuss today.   HPI  Vision:Within last year and Dental: No current dental problems and Receives regular dental care   Patient Active Problem List   Diagnosis Date Noted   Primary hypertension 10/27/2023   History of CVA (cerebrovascular accident) 10/27/2023   Hypothyroidism 10/27/2023   Mitral valve insufficiency 10/27/2023   Past Medical History:  Diagnosis Date   Allergy Nov 18, 2011   doxycycline   Anxiety ?   Cataract 18-Nov-2018???   both removed   Depression 2016-11-17   husband passed away   GERD (gastroesophageal reflux disease) ?   Heart murmur 1959   2 month premature   Hypertension 1990s  ?   mid 30'S   Neuromuscular disorder (HCC) 1994   C5-6  then C6-7 2014?   Sleep apnea 2020?   very small   Stroke [facility]) 11/18/2011   occipital lobe- balance/vision   Thyroid disease Nov 17, 2020??   underlying TSH   Past Surgical History:  Procedure Laterality Date   BREAST SURGERY  ?   tag put in   EYE SURGERY  2020/21   cataracts   LAMINECTOMY     SPINE SURGERY  1994   C5-6 laminectomy, ? 6-7 laminectomy/formanotomy   TUBAL LIGATION  ?   rods implanted   Social History   Tobacco Use   Smoking status: Former    Current packs/day: 0.00    Average packs/day: 0.5 packs/day for 30.0 years (15.0 ttl pk-yrs)    Types: Cigarettes    Quit date: 07/24/2007    Years since quitting: 16.2   Smokeless tobacco: Never   Tobacco comments:    quitting was the only really challenging thing in my life.  6 quits  Substance Use Topics   Alcohol use: Yes     Alcohol/week: 3.0 standard drinks of alcohol    Types: 3 Shots of liquor per week    Comment: 1 x month   Drug use: Not Currently    Types: Marijuana    Comment: When I was in my teens and early 11-18-23 smoked marijauna.      Medications: Outpatient Medications Prior to Visit  Medication Sig   Biotin 1000 MCG tablet Take 1,000 mcg by mouth.   Black Cohosh (RA BLACK COHOSH) 200 MG CAPS Take 200 mg by mouth.   Cholecalciferol 125 MCG (5000 UT) capsule Take 5,000 Units by mouth.   clopidogrel (PLAVIX) 75 MG tablet Take 1 tablet by mouth daily.   Coenzyme Q10 50 MG CAPS Take by mouth.   cyanocobalamin (VITAMIN B12) 1000 MCG/ML injection Inject 1,000 mcg into the muscle.   cyclobenzaprine (FLEXERIL) 10 MG tablet Take 10 mg by mouth.   Doxepin HCl 3 MG TABS Take 1 tablet (3 mg total) by mouth at bedtime.   levothyroxine (SYNTHROID) 50 MCG tablet Take 50 mcg by mouth daily.   Multiple Vitamins-Minerals (PRESERVISION AREDS PO)    olmesartan (BENICAR) 40 MG tablet Take 40 mg by mouth daily.   Omega-3 Fatty Acids (FISH OIL) 1000  MG CAPS Take by mouth.   omeprazole (PRILOSEC) 40 MG capsule Take 1 capsule by mouth daily.   tretinoin (RETIN-A) 0.025 % gel Apply 0.05 % topically.   triamcinolone cream (KENALOG) 0.1 % Apply topically.   zinc sulfate, 50mg  elemental zinc, 220 (50 Zn) MG capsule Take 220 mg by mouth.   zolpidem (AMBIEN) 10 MG tablet Take 10 mg by mouth at bedtime as needed.   No facility-administered medications prior to visit.    Allergies  Allergen Reactions   Doxycycline Nausea And Vomiting and Nausea Only    Patient Care Team: Lowella Benton CROME, [place] as PCP - General (Physician Assistant)  ROS Complete 12 point ROS performed with all pertinent positives listed in HPI     Objective  BP (!) 143/70   Pulse 72   Wt 170 lb (77.1 kg)   SpO2 94%  BP Readings from Last 3 Encounters:  11/05/23 (!) 143/70  10/27/23 (!) 151/71   Wt Readings from Last 3 Encounters:  11/05/23  170 lb (77.1 kg)  10/27/23 170 lb (77.1 kg)      Physical Exam Vitals and nursing note reviewed. Exam conducted with a chaperone present.  Constitutional:      General: She is not in acute distress.    Appearance: Normal appearance. She is not ill-appearing, toxic-appearing or diaphoretic.  HENT:     Head: Normocephalic and atraumatic.     Right Ear: Tympanic membrane, ear canal and external ear normal. There is no impacted cerumen.     Left Ear: Tympanic membrane, ear canal and external ear normal. There is no impacted cerumen.     Ears:     Comments: Hearing aids B    Nose: Nose normal.     Mouth/Throat:     Mouth: Mucous membranes are moist.     Pharynx: Oropharynx is clear. No oropharyngeal exudate or posterior oropharyngeal erythema.  Eyes:     General: No scleral icterus.       Right eye: No discharge.        Left eye: No discharge.     Extraocular Movements: Extraocular movements intact.  Neck:     Thyroid: No thyroid mass, thyromegaly or thyroid tenderness.  Cardiovascular:     Rate and Rhythm: Normal rate and regular rhythm.     Pulses: Normal pulses.     Heart sounds: Murmur heard.  Pulmonary:     Effort: Pulmonary effort is normal. No respiratory distress.     Breath sounds: Normal breath sounds. No stridor. No wheezing or rhonchi.  Musculoskeletal:     Cervical back: Normal range of motion and neck supple. No rigidity or tenderness.  Lymphadenopathy:     Cervical: No cervical adenopathy.  Skin:    General: Skin is warm and dry.     Coloration: Skin is not jaundiced.     Findings: No bruising, erythema or rash.  Neurological:     General: No focal deficit present.     Mental Status: She is alert and oriented to person, place, and time.  Psychiatric:        Mood and Affect: Mood normal.        Behavior: Behavior normal.   EKG: normal EKG, normal sinus rhythm.   Most recent functional status assessment:    11/06/2023   10:11 AM  In your present state of  health, do you have any difficulty performing the following activities:  Hearing? 1  Comment pt wears hearing aids  Vision? 0  Difficulty concentrating or making decisions? 0  Walking or climbing stairs? 0  Dressing or bathing? 0  Doing errands, shopping? 0  Preparing Food and eating ? N  Using the Toilet? N  In the past six months, have you accidently leaked urine? N  Do you have problems with loss of bowel control? N  Managing your Medications? N  Managing your Finances? N  Housekeeping or managing your Housekeeping? N   Most recent fall risk assessment:    11/03/2023    6:43 PM  Fall Risk   Falls in the past year? 1  Number falls in past yr: 0    Most recent depression screenings:     No data to display         Most recent cognitive screening:     No data to display         Most recent Audit-C alcohol use screening    11/03/2023    6:45 PM  Alcohol Use Disorder Test (AUDIT)  1. How often do you have a drink containing alcohol? 1  2. How many drinks containing alcohol do you have on a typical day when you are drinking? 0  3. How often do you have six or more drinks on one occasion? 0  AUDIT-C Score 1      Patient-reported   A score of 3 or more in women, and 4 or more in men indicates increased risk for alcohol abuse, EXCEPT if all of the points are from question 1   Vision/Hearing Screen: Hearing Screening   4000Hz   Right ear 40  Left ear 40   Vision Screening   Right eye Left eye Both eyes  Without correction 20/30 20/25 20/25   With correction       Last CBC Lab Results  Component Value Date   WBC 6.1 10/27/2023   HGB 13.6 10/27/2023   HCT 40.5 10/27/2023   MCV 94 10/27/2023   MCH 31.4 10/27/2023   RDW 12.4 10/27/2023   PLT 246 10/27/2023   Last metabolic panel Lab Results  Component Value Date   GLUCOSE 94 10/27/2023   NA 137 10/27/2023   K 4.4 10/27/2023   CL 98 10/27/2023   CO2 22 10/27/2023   BUN 12 10/27/2023   CREATININE  0.81 10/27/2023   EGFR 80 10/27/2023   CALCIUM 9.2 10/27/2023   PROT 6.4 10/27/2023   ALBUMIN 4.4 10/27/2023   LABGLOB 2.0 10/27/2023   BILITOT 0.4 10/27/2023   ALKPHOS 93 10/27/2023   AST 19 10/27/2023   ALT 14 10/27/2023   Last lipids Lab Results  Component Value Date   CHOL 203 (H) 10/27/2023   HDL 60 10/27/2023   LDLCALC 121 (H) 10/27/2023   TRIG 126 10/27/2023   CHOLHDL 3.4 10/27/2023   Last hemoglobin A1c Lab Results  Component Value Date   HGBA1C 5.9 (H) 10/27/2023   Last thyroid functions Lab Results  Component Value Date   TSH 2.330 10/27/2023   Last vitamin D Lab Results  Component Value Date   VD25OH 42.9 10/27/2023   Last vitamin B12 and Folate Lab Results  Component Value Date   VITAMINB12 394 10/27/2023   FOLATE 12.7 10/27/2023      No results found for any visits on 11/05/23.    Assessment & Plan   Annual wellness visit done today including the all of the following: Reviewed patient's Family Medical History Reviewed and updated list of patient's medical providers Assessment of cognitive impairment was done Assessed  patient's functional ability Established a written schedule for health screening services Health Risk Assessent Completed and Reviewed  Exercise Activities and Dietary recommendations  Goals      Achieve a Healthy Weight-Obesity     Timeframe:  Long-Range Goal Priority:  High Start Date:                             Expected End Date:                         - set goal weight    Why is this important?   When you are ready to manage your weight, have a plan and have set a goal, it is time to take action.  Taking small steps to change how you eat and exercise is a good place to start.    Notes:  GOAL WEIGHT 155#=     Activity and Exercise Increased     Evidence-based guidance:  Review current exercise levels.  Assess patient perspective on exercise or activity level, barriers to increasing activity, motivation and  readiness for change.  Recommend or set healthy exercise goal based on individual tolerance.  Encourage small steps toward making change in amount of exercise or activity.  Urge reduction of sedentary activities or screen time.  Promote group activities within the community or with family or support person.  Consider referral to rehabiliation therapist for assessment and exercise/activity plan.   Notes:      Blood Pressure < 140/90     Maintain normal BP     Safe Sleep Environment Maintained     Evidence-based guidance:   Provide anticipatory guidance regarding risks of cosleeping and sudden unexplained infant death.  Encourage use of safe sleep practices including always placing the baby on back in own crib, bassinette or cradle in the parents' room.  Keep the crib or bassinette free of pillows, bumper pads, stuffed toys and blankets.  Promote alternatives to cosleeping such as room-sharing or cosleeping bassinettes that attach to the parents' bed.  Encourage pacifier use at sleep times after breastfeeding is firmly established or soon after birth in bottle-fed infants.    Notes:  Pt would like to achieve good, sound sleep all night         There is no immunization history on file for this patient.  Health Maintenance  Topic Date Due   Medicare Annual Wellness (AWV)  Never done   COVID-19 Vaccine (1) Never done   Hepatitis C Screening  Never done   DTaP/Tdap/Td (1 - Tdap) Never done   Pneumococcal Vaccine: 50+ Years (1 of 2 - PCV) Never done   Zoster Vaccines- Shingrix (1 of 2) Never done   DEXA SCAN  Never done   Influenza Vaccine  08/21/2023   Mammogram  01/05/2025   Colonoscopy  12/25/2029   Meningococcal B Vaccine  Aged Out     Discussed health benefits of physical activity, and encouraged her to engage in regular exercise appropriate for her age and condition.    Problem List Items Addressed This Visit     Primary hypertension   Other Visit Diagnoses        Welcome to Medicare preventive visit    -  Primary   Relevant Orders   Rhythm ECG, report      Welcome to medicare exam completed. Reviewed functional status.  Vaccinations completed. EKG normal.  Goals discussed and reviewed  Return in about 3 months (around 02/02/2024) for Annual Physical.     Benton LITTIE Gave, PA

## 2023-11-06 ENCOUNTER — Telehealth: Payer: Self-pay

## 2023-11-06 ENCOUNTER — Other Ambulatory Visit (HOSPITAL_COMMUNITY): Payer: Self-pay

## 2023-11-06 ENCOUNTER — Encounter: Payer: Self-pay | Admitting: Urgent Care

## 2023-11-06 NOTE — Telephone Encounter (Signed)
 Forwarding message to the prior authorization department.

## 2023-11-06 NOTE — Telephone Encounter (Signed)
 Copied from CRM #[id number]. Topic: Clinical - Prescription Issue >> Nov 06, 2023 12:38 PM Jasmin G wrote: Reason for CRM: Ms. Justus from [facility] called to let clinic know that they need a pre authorization from Ms. Crain to refill Doxepin HCl 3 MG TABS or would need to discuss an alternative med to refill. Fax pre authorization at [phone] or call back to discuss at [phone].

## 2023-11-06 NOTE — Telephone Encounter (Signed)
 PA request has been Started.

## 2023-11-09 NOTE — Telephone Encounter (Signed)
 You're so welcome!   Pharmacy Patient Advocate Encounter  Received notification from [employer] that Prior Authorization for Doxepin HCl 3MG  tablets has been DENIED.  Full denial letter will be uploaded to the media tab. See denial reason below.  Because drug not on formulary, provider must detail adverse effects or inefficacy of preferred alternatives (Belsomra, eszopiclone) to approve coverage.   PA #/Case ID/Reference #: [id number]

## 2023-11-09 NOTE — Addendum Note (Signed)
 Addended by: BONNY JON DEL on: 11/09/2023 10:37 AM   Modules accepted: Orders

## 2023-11-10 ENCOUNTER — Telehealth: Payer: Self-pay

## 2023-11-10 ENCOUNTER — Other Ambulatory Visit (HOSPITAL_COMMUNITY): Payer: Self-pay

## 2023-11-10 MED ORDER — BELSOMRA 5 MG PO TABS: 5.0000 mg | ORAL_TABLET | Freq: Every evening | ORAL | 0 refills | Status: DC | PRN

## 2023-11-10 NOTE — Telephone Encounter (Signed)
 Copied from CRM [id number]. Topic: Clinical - Prescription Issue >> Nov 10, 2023 11:10 AM Gustabo D wrote: Doxepin HCl 3 MG TABS- pharmacy says this prescription isn't covered she would need something else like Trazondone, or zolpidem Call back [phone]

## 2023-11-10 NOTE — Telephone Encounter (Signed)
 Medication has been changed to Belsmora per patient chart.  Will the Belsomra require a prior authorization?

## 2023-11-11 ENCOUNTER — Telehealth: Payer: Self-pay

## 2023-11-11 ENCOUNTER — Other Ambulatory Visit (HOSPITAL_COMMUNITY): Payer: Self-pay

## 2023-11-11 NOTE — Telephone Encounter (Signed)
 Pharmacy Patient Advocate Encounter   Received notification from Pt Calls Messages that prior authorization for [employer]  is required/requested.   Insurance verification completed.   The patient is insured through [employer].   Per test claim: PA required; PA submitted to above mentioned insurance via Latent Key/confirmation #/EOC [employer] Status is pending

## 2023-11-11 NOTE — Telephone Encounter (Signed)
 Pharmacy Patient Advocate Encounter  Received notification from [employer] that Prior Authorization for Belsomra 5mg  tabs has been APPROVED from 11/11/23 to 01/19/25   PA #/Case ID/Reference #: Approval letter has been uploaded to media tab

## 2023-11-17 MED ORDER — DOXEPIN HCL 6 MG PO TABS: 0.5000 | ORAL_TABLET | Freq: Every evening | ORAL | 5 refills | Status: DC

## 2023-11-17 NOTE — Addendum Note (Signed)
 Addended by: Astaria Nanez on: 11/17/2023 01:01 PM   Modules accepted: Orders

## 2023-11-19 ENCOUNTER — Encounter: Payer: Self-pay | Admitting: Neurology

## 2023-11-19 ENCOUNTER — Ambulatory Visit (INDEPENDENT_AMBULATORY_CARE_PROVIDER_SITE_OTHER): Admitting: Neurology

## 2023-11-19 ENCOUNTER — Ambulatory Visit

## 2023-11-19 VITALS — BP 128/68 | HR 60 | Ht 66.5 in | Wt 166.0 lb

## 2023-11-19 DIAGNOSIS — G4700 Insomnia, unspecified: Secondary | ICD-10-CM | POA: Diagnosis not present

## 2023-11-19 DIAGNOSIS — Z8669 Personal history of other diseases of the nervous system and sense organs: Secondary | ICD-10-CM

## 2023-11-19 DIAGNOSIS — R351 Nocturia: Secondary | ICD-10-CM

## 2023-11-19 DIAGNOSIS — Z8673 Personal history of transient ischemic attack (TIA), and cerebral infarction without residual deficits: Secondary | ICD-10-CM | POA: Diagnosis not present

## 2023-11-19 DIAGNOSIS — E663 Overweight: Secondary | ICD-10-CM

## 2023-11-19 DIAGNOSIS — R634 Abnormal weight loss: Secondary | ICD-10-CM

## 2023-11-19 NOTE — Patient Instructions (Signed)

## 2023-11-19 NOTE — Progress Notes (Addendum)
 Subjective:    Patient ID: Dorothy Ray is a 68 y.o. female.  HPI    True Mar, MD, PhD [facility] [address], [address] [address] [place], [place] [address]  Dear Dorothy Ray,   I saw your patient, Dorothy Ray, upon your kind request in my sleep clinic today for initial consultation of her sleep disorder, in particular, evaluation of her prior diagnosis of OSA.  The patient is unaccompanied today.  As you know, Dorothy Ray is a 67 year old female with an underlying medical history of reflux disease, prediabetes, thyroid disease, history of stroke, allergy, cataracts, depression, anxiety, insomnia and overweight state, who was previously diagnosed with obstructive sleep apnea and placed on PAP therapy.  Her Epworth sleepiness score is 1 out of 24, fatigue severity score is 49 out of 63.  She is no longer on PAP therapy.  She had sleep testing in or around 12/17/16 and was on AutoPap therapy for about a year or 18 months. She had seen Dr. Adnan Javaid in the past.  She has not seen him since 12-17-2021 from what I can see in her chart.  She reports that she was diagnosed with mild obstructive sleep apnea.  Prior sleep encounter indicates that she was on AutoPap of 5 to 15 cm.  She has worked on weight loss and lost about 30 pounds altogether thus far.  She would be willing to get retested and consider PAP therapy again.  She lives with her husband of 4 years.  She lost her first husband in Dec 17, 2016.  She has chronic insomnia and takes Ambien 10 mg each night, Xanax 0.5 mg each night, melatonin high dose, she is not sure if it is 30 or 50 mg.  She has a prescription for Belsomra but does not recall taking it.  I reviewed your office note from 10/27/2023.  She was advised to stop taking Xanax and start doxepin 3 mg each night with increase to 6 mg as needed.  She has not started it yet.  Bedtime is generally between 9 and 10.  She does watch TV in her room, turns the TV off around 10:30 PM.   Rise time is around 8 AM but she is often awake before then.  She has nocturia about once or twice per average night, denies recurrent nocturnal or morning headaches.  She is not aware of any family history of sleep apnea.  They have 2 dogs and 1 cat in the household, the pets typically sleep on the floor in the bedroom, not on the bed.  She drinks caffeine in the form of diet soda, about 2-1/2 servings per day on average.  She does not drink any alcohol.  She quit smoking about 15 years ago.    Her Past Medical History Is Significant For: Past Medical History:  Diagnosis Date   Allergy 2011/12/18   doxycycline   Anxiety ?   Cataract 2018-12-18???   both removed   Depression 2016-12-17   husband passed away   GERD (gastroesophageal reflux disease) ?   Heart murmur 1959   2 month premature   Hypertension 1990s  ?   mid 30'S   Neuromuscular disorder (HCC) 1994   C5-6  then C6-7 2014?   Sleep apnea 2020?   very small   Stroke [facility]) Dec 18, 2011   occipital lobe- balance/vision   Thyroid disease 2020/12/17??   underlying TSH    Her Past Surgical History Is Significant For: Past Surgical History:  Procedure Laterality Date   BREAST SURGERY  ?   tag put in   EYE SURGERY  2020/21   cataracts   LAMINECTOMY     SPINE SURGERY  1994   C5-6 laminectomy, ? 6-7 laminectomy/formanotomy   TUBAL LIGATION  ?   rods implanted    Her Family History Is Significant For: Family History  Problem Relation Age of Onset   Breast cancer Sister        breast cancer twice    Alcohol abuse Mother    Hypertension Mother    Vision loss Mother    Cancer Father    Hypertension Father    Cancer Sister    Cancer Sister     Her Social History Is Significant For: Social History   Socioeconomic History   Marital status: Married    Spouse name: Not on file   Number of children: Not on file   Years of education: Not on file   Highest education level: Bachelor's degree (e.g., BA, AB, BS)  Occupational History   Not on file   Tobacco Use   Smoking status: Former    Current packs/day: 0.00    Average packs/day: 0.5 packs/day for 30.0 years (15.0 ttl pk-yrs)    Types: Cigarettes    Quit date: 07/24/2007    Years since quitting: 16.3   Smokeless tobacco: Never   Tobacco comments:    quitting was the only really challenging thing in my life.  6 quits  Substance and Sexual Activity   Alcohol use: Yes    Alcohol/week: 3.0 standard drinks of alcohol    Types: 3 Shots of liquor per week    Comment: 1 x month   Drug use: Not Currently    Types: Marijuana    Comment: When I was in my teens and early 20's smoked marijauna.   Sexual activity: Yes    Birth control/protection: Post-menopausal  Other Topics Concern   Not on file  Social History Narrative   A liter of diet soda last her 3 weeks    Social Drivers of Corporate Investment Banker Strain: Low Risk  (11/03/2023)   Overall Financial Resource Strain (CARDIA)    Difficulty of Paying Living Expenses: Not hard at all  Food Insecurity: No Food Insecurity (11/03/2023)   Hunger Vital Sign    Worried About Running Out of Food in the Last Year: Never true    Ran Out of Food in the Last Year: Never true  Transportation Needs: No Transportation Needs (11/03/2023)   PRAPARE - Administrator, Civil Service (Medical): No    Lack of Transportation (Non-Medical): No  Physical Activity: Insufficiently Active (11/03/2023)   Exercise Vital Sign    Days of Exercise per Week: 2 days    Minutes of Exercise per Session: 40 min  Stress: Stress Concern Present (11/03/2023)   [employer] of Occupational Health - Occupational Stress Questionnaire    Feeling of Stress: Rather much  Social Connections: Socially Integrated (11/03/2023)   Social Connection and Isolation Panel    Frequency of Communication with Friends and Family: More than three times a week    Frequency of Social Gatherings with Friends and Family: Twice a week    Attends Religious Services:  More than 4 times per year    Active Member of [employer] or Organizations: Yes    Attends Engineer, Structural: More than 4 times per year    Marital Status: Married  Her Allergies Are:  Allergies  Allergen Reactions   Doxycycline Nausea And Vomiting and Nausea Only  :   Her Current Medications Are:  Outpatient Encounter Medications as of 11/19/2023  Medication Sig   ALPRAZolam (XANAX) 0.5 MG tablet Take 1 tablet by mouth.   ascorbic acid (VITAMIN C) 500 MG tablet Take 500 mg by mouth.   Biotin 1000 MCG tablet Take 1,000 mcg by mouth.   Cholecalciferol 125 MCG (5000 UT) capsule Take 5,000 Units by mouth.   Coenzyme Q10 50 MG CAPS Take by mouth.   cyanocobalamin (VITAMIN B12) 1000 MCG/ML injection Inject 1,000 mcg into the muscle.   Doxepin HCl 6 MG TABS Take 0.5 tablets (3 mg total) by mouth at bedtime. (Patient taking differently: Take 0.5 tablets by mouth at bedtime. Has not started)   levothyroxine (SYNTHROID) 50 MCG tablet Take 50 mcg by mouth daily.   Multiple Vitamins-Minerals (PRESERVISION AREDS PO)    olmesartan (BENICAR) 40 MG tablet Take 40 mg by mouth daily.   Omega-3 Fatty Acids (FISH OIL) 1000 MG CAPS Take by mouth.   omeprazole (PRILOSEC) 40 MG capsule Take 1 capsule by mouth daily.   triamcinolone cream (KENALOG) 0.1 % Apply topically.   zolpidem (AMBIEN) 10 MG tablet Take 10 mg by mouth.   Black Cohosh (RA BLACK COHOSH) 200 MG CAPS Take 200 mg by mouth. (Patient not taking: Reported on 11/19/2023)   clopidogrel (PLAVIX) 75 MG tablet Take 1 tablet by mouth daily. (Patient not taking: Reported on 11/19/2023)   Suvorexant (BELSOMRA) 5 MG TABS Take 1 tablet (5 mg total) by mouth at bedtime as needed. (Patient not taking: Reported on 11/19/2023)   tretinoin (RETIN-A) 0.025 % gel Apply 0.05 % topically. (Patient not taking: Reported on 11/19/2023)   zinc sulfate, 50mg  elemental zinc, 220 (50 Zn) MG capsule Take 220 mg by mouth. (Patient not taking: Reported on  11/19/2023)   No facility-administered encounter medications on file as of 11/19/2023.  :   Review of Systems:  Out of a complete 14 point review of systems, all are reviewed and negative with the exception of these symptoms as listed below:  Review of Systems  Objective:  Neurological Exam  Physical Exam Physical Examination:   Vitals:   11/19/23 1143 11/19/23 1151  BP: 101/60 128/68  Pulse: 60   SpO2: 99%     General Examination: The patient is a very pleasant 68 y.o. female in no acute distress. She appears well-developed and well-nourished and well groomed.   HEENT: Normocephalic, atraumatic, pupils are equal, round and reactive to light, extraocular tracking is good without limitation to gaze excursion or nystagmus noted. no photophobia.  Face is symmetric with normal facial animation. Speech is clear without dysarthria. There is no hypophonia. There is no lip, neck/head, jaw or voice tremor. Neck is supple with full range of passive and active motion. There are no carotid bruits on auscultation.  Airway/Oropharynx exam reveals: mild mouth dryness, adequate dental hygiene with partial plate on top, moderate airway crowding secondary to small airway entry and prominent uvula, Mallampati class II.  Tonsils absent.  Neck circumference 15 1/8 inches.  Tongue protrudes centrally and palate elevates symmetrically.  Mild overbite noted.    Chest: Clear to auscultation without wheezing, rhonchi or crackles noted.  Heart: S1+S2+0, regular and normal without murmurs, rubs or gallops noted.   Abdomen: Soft, non-tender and non-distended.  Extremities: There is no obvious swelling in the distal lower extremities bilaterally.   Skin: Warm and  dry without trophic changes noted.   Musculoskeletal: exam reveals no obvious joint deformities.   Neurologically:  Mental status: The patient is awake, alert and oriented in all 4 spheres. Her immediate and remote memory, attention, language  skills and fund of knowledge are appropriate. There is no evidence of aphasia, agnosia, apraxia or anomia. Speech is clear with normal prosody and enunciation. Thought process is linear. Mood is normal and affect is normal.  Cranial nerves II - XII are as described above under HEENT exam.  Motor exam: Normal bulk, moving all 4 extremities without restriction, no obvious action or resting tremor.  Fine motor skills and coordination: Intact grossly.  Cerebellar testing: No dysmetria or intention tremor. There is no truncal or gait ataxia.  Sensory exam: intact to light touch in the upper and lower extremities.  Gait, station and balance: She stands easily. No veering to one side is noted. No leaning to one side is noted. Posture is age-appropriate and stance is narrow based. Gait shows normal stride length and normal pace. No problems turning are noted.   Assessment and plan:   In summary, Dorothy Ray is a very pleasant 68 y.o.-year old female with an underlying medical history of reflux disease, prediabetes, thyroid disease, history of stroke, allergy, cataracts, depression, anxiety, insomnia and overweight state, who presents for evaluation of her prior diagnosis of obstructive sleep apnea. A laboratory attended sleep study is typically considered gold standard for evaluation of sleep disordered breathing.   I had a long chat with the patient about my findings and the diagnosis of sleep apnea, particularly OSA, its prognosis and treatment options. We talked about medical/conservative treatments, surgical interventions and non-pharmacological approaches for symptom control. I explained, in particular, the risks and ramifications of untreated moderate to severe OSA, especially with respect to developing cardiovascular disease down the road, including congestive heart failure (CHF), difficult to treat hypertension, cardiac arrhythmias (particularly A-fib), neurovascular complications including TIA,  stroke and dementia. Even type 2 diabetes has, in part, been linked to untreated OSA. Symptoms of untreated OSA may include (but may not be limited to) daytime sleepiness, nocturia (i.e. frequent nighttime urination), memory problems, mood irritability and suboptimally controlled or worsening mood disorder such as depression and/or anxiety, lack of energy, lack of motivation, physical discomfort, as well as recurrent headaches, especially morning or nocturnal headaches. We talked about the importance of maintaining a healthy lifestyle and striving for healthy weight. In addition, we talked about the importance of striving for and maintaining good sleep hygiene. I recommended a sleep study at this time. I outlined the differences between a laboratory attended sleep study which is considered more comprehensive and accurate over the option of a home sleep test (HST); the latter may lead to underestimation of sleep disordered breathing in some instances and does not help with diagnosing upper airway resistance syndrome and is not accurate enough to diagnose primary central sleep apnea typically. I outlined possible surgical and non-surgical treatment options of OSA, including the use of a positive airway pressure (PAP) device (i.e. CPAP, AutoPAP/APAP or BiPAP in certain circumstances), a custom-made dental device (aka oral appliance, which would require a referral to a specialist dentist or orthodontist typically, and is generally speaking not considered for patients with full dentures or edentulous state), upper airway surgical options, such as traditional UPPP (which is not considered a first-line treatment) or the Inspire device (hypoglossal nerve stimulator, which would involve a referral for consultation with an ENT surgeon, after careful selection, following  inclusion criteria - also not first-line treatment). I explained the PAP treatment option to the patient in detail, as this is generally considered  first-line treatment.  The patient indicated that she would be willing to try PAP therapy, if the need arises. I explained the importance of being compliant with PAP treatment, not only for insurance purposes but primarily to improve patient's symptoms symptoms, and for the patient's long term health benefit, including to reduce Her cardiovascular risks longer-term.    We will pick up our discussion about the next steps and treatment options after testing.  We will keep her posted as to the test results by phone call and/or MyChart messaging where possible.  We will plan to follow-up in sleep clinic accordingly as well.  I answered all her questions today and the patient was in agreement.   I encouraged her to call with any interim questions, concerns, problems or updates or email us  through MyChart.  Generally speaking, sleep test authorizations may take up to 2 weeks, sometimes less, sometimes longer, the patient is encouraged to get in touch with us  if they do not hear back from the sleep lab staff directly within the next 2 weeks.  Thank you very much for allowing me to participate in the care of this nice patient. If I can be of any further assistance to you please do not hesitate to call me at [phone].  Sincerely,   True Mar, MD, PhD

## 2023-11-23 ENCOUNTER — Telehealth: Admitting: Physician Assistant

## 2023-11-23 DIAGNOSIS — J069 Acute upper respiratory infection, unspecified: Secondary | ICD-10-CM

## 2023-11-23 DIAGNOSIS — R058 Other specified cough: Secondary | ICD-10-CM

## 2023-11-23 NOTE — Patient Instructions (Signed)
 Dorothy Ray, thank you for joining Dorothy Shuck, PA-C for today's virtual visit.  While this provider is not your primary care provider (PCP), if your PCP is located in our provider database this encounter information will be shared with them immediately following your visit.   A [facility] MyChart account gives you access to today's visit and all your visits, tests, and labs performed at [facility]  click here if you don't have a [facility] MyChart account or go to [email or web address]  Consent: (Patient) Dorothy Ray provided verbal consent for this virtual visit at the beginning of the encounter.  Current Medications:  Current Outpatient Medications:    ALPRAZolam (XANAX) 0.5 MG tablet, Take 1 tablet by mouth., Disp: , Rfl:    ascorbic acid (VITAMIN C) 500 MG tablet, Take 500 mg by mouth., Disp: , Rfl:    Biotin 1000 MCG tablet, Take 1,000 mcg by mouth., Disp: , Rfl:    Black Cohosh (RA BLACK COHOSH) 200 MG CAPS, Take 200 mg by mouth. (Patient not taking: Reported on 11/19/2023), Disp: , Rfl:    Cholecalciferol 125 MCG (5000 UT) capsule, Take 5,000 Units by mouth., Disp: , Rfl:    clopidogrel (PLAVIX) 75 MG tablet, Take 1 tablet by mouth daily. (Patient not taking: Reported on 11/19/2023), Disp: , Rfl:    Coenzyme Q10 50 MG CAPS, Take by mouth., Disp: , Rfl:    cyanocobalamin (VITAMIN B12) 1000 MCG/ML injection, Inject 1,000 mcg into the muscle., Disp: , Rfl:    Doxepin HCl 6 MG TABS, Take 0.5 tablets (3 mg total) by mouth at bedtime. (Patient taking differently: Take 0.5 tablets by mouth at bedtime. Has not started), Disp: 30 tablet, Rfl: 5   levothyroxine (SYNTHROID) 50 MCG tablet, Take 50 mcg by mouth daily., Disp: , Rfl:    Multiple Vitamins-Minerals (PRESERVISION AREDS PO), , Disp: , Rfl:    olmesartan (BENICAR) 40 MG tablet, Take 40 mg by mouth daily., Disp: , Rfl:    Omega-3 Fatty Acids (FISH OIL) 1000 MG CAPS, Take by mouth., Disp: , Rfl:    omeprazole  (PRILOSEC) 40 MG capsule, Take 1 capsule by mouth daily., Disp: , Rfl:    Suvorexant (BELSOMRA) 5 MG TABS, Take 1 tablet (5 mg total) by mouth at bedtime as needed. (Patient not taking: Reported on 11/19/2023), Disp: 90 tablet, Rfl: 0   tretinoin (RETIN-A) 0.025 % gel, Apply 0.05 % topically. (Patient not taking: Reported on 11/19/2023), Disp: , Rfl:    triamcinolone cream (KENALOG) 0.1 %, Apply topically., Disp: , Rfl:    zinc sulfate, 50mg  elemental zinc, 220 (50 Zn) MG capsule, Take 220 mg by mouth. (Patient not taking: Reported on 11/19/2023), Disp: , Rfl:    zolpidem (AMBIEN) 10 MG tablet, Take 10 mg by mouth., Disp: , Rfl:    Medications ordered in this encounter:  No orders of the defined types were placed in this encounter.    *If you need refills on other medications prior to your next appointment, please contact your pharmacy*  Follow-Up: Call back or seek an in-person evaluation if the symptoms worsen or if the condition fails to improve as anticipated.  [facility] [phone]  Other Instructions Report to Urgent care for evaluation.    If you have been instructed to have an in-person evaluation today at a local Urgent Care facility, please use the link below. It will take you to a list of all of our available [facility], including  address, phone number and hours of operation. Please do not delay care.  [facility]  If you or a family member do not have a primary care provider, use the link below to schedule a visit and establish care. When you choose a [facility] primary care physician or advanced practice provider, you gain a long-term partner in health. Find a Primary Care Provider  Learn more about [facility] in-office and virtual care options: [facility] - Get Care Now

## 2023-11-23 NOTE — Progress Notes (Signed)
 Virtual Visit Consent   [name], you are scheduled for a virtual visit with a [facility] provider today. Just as with appointments in the office, your consent must be obtained to participate. Your consent will be active for this visit and any virtual visit you may have with one of our providers in the next 365 days. If you have a MyChart account, a copy of this consent can be sent to you electronically.  As this is a virtual visit, video technology does not allow for your provider to perform a traditional examination. This may limit your provider's ability to fully assess your condition. If your provider identifies any concerns that need to be evaluated in person or the need to arrange testing (such as labs, EKG, etc.), we will make arrangements to do so. Although advances in technology are sophisticated, we cannot ensure that it will always work on either your end or our end. If the connection with a video visit is poor, the visit may have to be switched to a telephone visit. With either a video or telephone visit, we are not always able to ensure that we have a secure connection.  By engaging in this virtual visit, you consent to the provision of healthcare and authorize for your insurance to be billed (if applicable) for the services provided during this visit. Depending on your insurance coverage, you may receive a charge related to this service.  I need to obtain your verbal consent now. Are you willing to proceed with your visit today? Dorothy Ray has provided verbal consent on 11/23/2023 for a virtual visit (video or telephone). Dorothy Ray, [place]  Date: 11/23/2023 11:44 AM   Virtual Visit via Video Note   I, Dorothy Ray, [name]  ([id number], 04/26/1955) on 11/23/23 at 11:45 AM EST by a video-enabled telemedicine application and verified that I am speaking with the correct person using two identifiers.  Location: Patient: Virtual Visit Location Patient:  Home Provider: Virtual Visit Location Provider: Home Office   I discussed the limitations of evaluation and management by telemedicine and the availability of in person appointments. The patient expressed understanding and agreed to proceed.    History of Present Illness: Dorothy Ray is a 68 y.o. who identifies as a female who was assigned female at birth, and is being seen today for ongoing cough x 1 week after urgent care visit.  HPI: URI  This is a new problem. The current episode started in the past 7 days. The problem has been unchanged.    Problems:  Patient Active Problem List   Diagnosis Date Noted   Primary hypertension 10/27/2023   History of CVA (cerebrovascular accident) 10/27/2023   Hypothyroidism 10/27/2023   Mitral valve insufficiency 10/27/2023    Allergies:  Allergies  Allergen Reactions   Doxycycline Nausea And Vomiting and Nausea Only   Medications:  Current Outpatient Medications:    ALPRAZolam (XANAX) 0.5 MG tablet, Take 1 tablet by mouth., Disp: , Rfl:    ascorbic acid (VITAMIN C) 500 MG tablet, Take 500 mg by mouth., Disp: , Rfl:    Biotin 1000 MCG tablet, Take 1,000 mcg by mouth., Disp: , Rfl:    Black Cohosh (RA BLACK COHOSH) 200 MG CAPS, Take 200 mg by mouth. (Patient not taking: Reported on 11/19/2023), Disp: , Rfl:    Cholecalciferol 125 MCG (5000 UT) capsule, Take 5,000 Units by mouth., Disp: , Rfl:    clopidogrel (PLAVIX) 75 MG tablet, Take  1 tablet by mouth daily. (Patient not taking: Reported on 11/19/2023), Disp: , Rfl:    Coenzyme Q10 50 MG CAPS, Take by mouth., Disp: , Rfl:    cyanocobalamin (VITAMIN B12) 1000 MCG/ML injection, Inject 1,000 mcg into the muscle., Disp: , Rfl:    Doxepin HCl 6 MG TABS, Take 0.5 tablets (3 mg total) by mouth at bedtime. (Patient taking differently: Take 0.5 tablets by mouth at bedtime. Has not started), Disp: 30 tablet, Rfl: 5   levothyroxine (SYNTHROID) 50 MCG tablet, Take 50 mcg by mouth daily., Disp: , Rfl:     Multiple Vitamins-Minerals (PRESERVISION AREDS PO), , Disp: , Rfl:    olmesartan (BENICAR) 40 MG tablet, Take 40 mg by mouth daily., Disp: , Rfl:    Omega-3 Fatty Acids (FISH OIL) 1000 MG CAPS, Take by mouth., Disp: , Rfl:    omeprazole (PRILOSEC) 40 MG capsule, Take 1 capsule by mouth daily., Disp: , Rfl:    Suvorexant (BELSOMRA) 5 MG TABS, Take 1 tablet (5 mg total) by mouth at bedtime as needed. (Patient not taking: Reported on 11/19/2023), Disp: 90 tablet, Rfl: 0   tretinoin (RETIN-A) 0.025 % gel, Apply 0.05 % topically. (Patient not taking: Reported on 11/19/2023), Disp: , Rfl:    triamcinolone cream (KENALOG) 0.1 %, Apply topically., Disp: , Rfl:    zinc sulfate, 50mg  elemental zinc, 220 (50 Zn) MG capsule, Take 220 mg by mouth. (Patient not taking: Reported on 11/19/2023), Disp: , Rfl:    zolpidem (AMBIEN) 10 MG tablet, Take 10 mg by mouth., Disp: , Rfl:   Observations/Objective: Patient is well-developed, well-nourished in no acute distress.  Resting comfortably  at home.  Head is normocephalic, atraumatic.  No labored breathing.  Speech is clear and coherent with logical content.  Patient is alert and oriented at baseline.    Assessment and Plan: 1. Upper respiratory tract infection, unspecified type (Primary)  2. Cough productive of clear sputum  Patient with ongoing cough x 1 week after being seen at [facility]. No chest xray done and now with more sputum production. No audible wheezing or respiratory distress noted during call on today. Recommend return to in person eval for diagnostics to rule out PNA or other chronic respiratory illness as patient has a 30+ year smoking history. She agreed to this plan.   Follow Up Instructions: I discussed the assessment and treatment plan with the patient. The patient was provided an opportunity to ask questions and all were answered. The patient agreed with the plan and demonstrated an understanding of the instructions.  A copy of instructions  were sent to the patient via MyChart unless otherwise noted below.     The patient was advised to call back or seek an in-person evaluation if the symptoms worsen or if the condition fails to improve as anticipated.    Dorothy Shuck, PA-C

## 2023-11-24 ENCOUNTER — Ambulatory Visit

## 2023-11-25 ENCOUNTER — Ambulatory Visit: Admitting: Urgent Care

## 2023-11-25 ENCOUNTER — Ambulatory Visit: Payer: Self-pay | Admitting: Urgent Care

## 2023-11-25 ENCOUNTER — Ambulatory Visit

## 2023-11-25 ENCOUNTER — Encounter: Payer: Self-pay | Admitting: Urgent Care

## 2023-11-25 VITALS — BP 173/80 | HR 64 | Ht 66.5 in | Wt 165.0 lb

## 2023-11-25 DIAGNOSIS — R051 Acute cough: Secondary | ICD-10-CM | POA: Diagnosis not present

## 2023-11-25 DIAGNOSIS — R042 Hemoptysis: Secondary | ICD-10-CM

## 2023-11-25 DIAGNOSIS — I10 Essential (primary) hypertension: Secondary | ICD-10-CM

## 2023-11-25 DIAGNOSIS — J069 Acute upper respiratory infection, unspecified: Secondary | ICD-10-CM | POA: Diagnosis not present

## 2023-11-25 DIAGNOSIS — E559 Vitamin D deficiency, unspecified: Secondary | ICD-10-CM | POA: Insufficient documentation

## 2023-11-25 DIAGNOSIS — F419 Anxiety disorder, unspecified: Secondary | ICD-10-CM | POA: Insufficient documentation

## 2023-11-25 DIAGNOSIS — E782 Mixed hyperlipidemia: Secondary | ICD-10-CM | POA: Insufficient documentation

## 2023-11-25 DIAGNOSIS — J45909 Unspecified asthma, uncomplicated: Secondary | ICD-10-CM | POA: Insufficient documentation

## 2023-11-25 MED ORDER — AMLODIPINE BESYLATE 5 MG PO TABS: 5.0000 mg | ORAL_TABLET | Freq: Every day | ORAL | 1 refills | Status: DC

## 2023-11-25 MED ORDER — AMOXICILLIN-POT CLAVULANATE 875-125 MG PO TABS: 1.0000 | ORAL_TABLET | Freq: Two times a day (BID) | ORAL | 0 refills | Status: DC

## 2023-11-25 NOTE — Progress Notes (Unsigned)
 Blood pressure reading on patient's home monitor was 159/91 HR 65.

## 2023-11-25 NOTE — Progress Notes (Unsigned)
 Established Patient Office Visit  Subjective:  Patient ID: Dorothy Ray, female    DOB: [birth year]  Age: 68 y.o. MRN: [id number]  Chief Complaint  Patient presents with   Follow-up    Blood pressure   Cough    And congestion x 2 weeks with green/yellow mucus    HPI  Patient Active Problem List   Diagnosis Date Noted   Primary hypertension 10/27/2023   History of CVA (cerebrovascular accident) 10/27/2023   Hypothyroidism 10/27/2023   Mitral valve insufficiency 10/27/2023   Past Medical History:  Diagnosis Date   Allergy 2011-12-03   doxycycline   Anxiety ?   Cataract Dec 03, 2018???   both removed   Depression 02-Dec-2016   husband passed away   GERD (gastroesophageal reflux disease) ?   Heart murmur 1959   2 month premature   Hypertension 1990s  ?   mid 30'S   Neuromuscular disorder (HCC) 1994   C5-6  then C6-7 2014?   Sleep apnea 2020?   very small   Stroke [facility]) 2011-12-03   occipital lobe- balance/vision   Thyroid disease 2020/12/02??   underlying TSH   Past Surgical History:  Procedure Laterality Date   BREAST SURGERY  ?   tag put in   EYE SURGERY  2020/21   cataracts   LAMINECTOMY     SPINE SURGERY  1994   C5-6 laminectomy, ? 6-7 laminectomy/formanotomy   TUBAL LIGATION  ?   rods implanted   Social History   Tobacco Use   Smoking status: Former    Current packs/day: 0.00    Average packs/day: 0.5 packs/day for 30.0 years (15.0 ttl pk-yrs)    Types: Cigarettes    Quit date: 07/24/2007    Years since quitting: 16.3   Smokeless tobacco: Never   Tobacco comments:    quitting was the only really challenging thing in my life.  6 quits  Substance Use Topics   Alcohol use: Yes    Alcohol/week: 3.0 standard drinks of alcohol    Types: 3 Shots of liquor per week    Comment: 1 x month   Drug use: Not Currently    Types: Marijuana    Comment: When I was in my teens and early 12-03-23 smoked marijauna.      ROS: as noted in HPI  Objective:     BP (!) 173/80   Pulse 64    Ht 5' 6.5 (1.689 m)   Wt 165 lb (74.8 kg)   SpO2 97%   BMI 26.23 kg/m  BP Readings from Last 3 Encounters:  11/25/23 (!) 173/80  11/19/23 128/68  11/05/23 (!) 143/70   Wt Readings from Last 3 Encounters:  11/25/23 165 lb (74.8 kg)  11/19/23 166 lb (75.3 kg)  11/05/23 170 lb (77.1 kg)      Physical Exam   No results found for any visits on 11/25/23.  Last CBC Lab Results  Component Value Date   WBC 6.1 10/27/2023   HGB 13.6 10/27/2023   HCT 40.5 10/27/2023   MCV 94 10/27/2023   MCH 31.4 10/27/2023   RDW 12.4 10/27/2023   PLT 246 10/27/2023   Last metabolic panel Lab Results  Component Value Date   GLUCOSE 94 10/27/2023   NA 137 10/27/2023   K 4.4 10/27/2023   CL 98 10/27/2023   CO2 22 10/27/2023   BUN 12 10/27/2023   CREATININE 0.81 10/27/2023   EGFR 80 10/27/2023   CALCIUM 9.2 10/27/2023   PROT 6.4  10/27/2023   ALBUMIN 4.4 10/27/2023   LABGLOB 2.0 10/27/2023   BILITOT 0.4 10/27/2023   ALKPHOS 93 10/27/2023   AST 19 10/27/2023   ALT 14 10/27/2023   Last lipids Lab Results  Component Value Date   CHOL 203 (H) 10/27/2023   HDL 60 10/27/2023   LDLCALC 121 (H) 10/27/2023   TRIG 126 10/27/2023   CHOLHDL 3.4 10/27/2023   Last hemoglobin A1c Lab Results  Component Value Date   HGBA1C 5.9 (H) 10/27/2023   Last thyroid functions Lab Results  Component Value Date   TSH 2.330 10/27/2023   FREET4 1.33 10/27/2023   Last vitamin D Lab Results  Component Value Date   VD25OH 42.9 10/27/2023   Last vitamin B12 and Folate Lab Results  Component Value Date   VITAMINB12 394 10/27/2023   FOLATE 12.7 10/27/2023      The ASCVD Risk score (Arnett DK, et al., 2019) failed to calculate for the following reasons:   Risk score cannot be calculated because patient has a medical history suggesting prior/existing ASCVD  Assessment & Plan:  Acute cough  Upper respiratory tract infection, unspecified type     No follow-ups on file.   Benton LITTIE Gave,  PA

## 2023-11-26 NOTE — Patient Instructions (Signed)
 Start taking augmentin twice daily with food. Use nasal saline spray to help flush nasal passages.  Restart amlodipine and take in addition to olmesartan for blood pressure. Monitor at home - goal 120/80.

## 2023-12-07 ENCOUNTER — Telehealth: Payer: Self-pay | Admitting: Neurology

## 2023-12-07 NOTE — Telephone Encounter (Signed)
 [employer] pending

## 2023-12-14 NOTE — Telephone Encounter (Signed)
 [employer]PSG Mylene barrows: [id number] (exp. 12/15/23 to 02/12/24)

## 2023-12-15 ENCOUNTER — Ambulatory Visit: Admitting: Urgent Care

## 2023-12-15 VITALS — BP 136/72 | HR 63 | Ht 66.5 in | Wt 163.0 lb

## 2023-12-15 DIAGNOSIS — M5442 Lumbago with sciatica, left side: Secondary | ICD-10-CM

## 2023-12-15 MED ORDER — CARISOPRODOL 350 MG PO TABS: 350.0000 mg | ORAL_TABLET | Freq: Three times a day (TID) | ORAL | 0 refills | Status: AC | PRN

## 2023-12-15 MED ORDER — METHYLPREDNISOLONE 4 MG PO TBPK: ORAL_TABLET | ORAL | 0 refills | Status: DC

## 2023-12-15 NOTE — Progress Notes (Unsigned)
 Established Patient Office Visit  Subjective:  Patient ID: Dorothy Ray, female    DOB: February 14, 1955  Age: 68 y.o. MRN: [id number]  Chief Complaint  Patient presents with   Back Pain    7-10 days ago with left thigh burning    HPI  Patient Active Problem List   Diagnosis Date Noted   Anxiety disorder 11/25/2023   Asthmatic bronchitis 11/25/2023   Vitamin D  deficiency 11/25/2023   Mixed hyperlipidemia 11/25/2023   Primary hypertension 10/27/2023   History of CVA (cerebrovascular accident) 10/27/2023   Hypothyroidism 10/27/2023   Mitral valve insufficiency 10/27/2023   Blepharochalasis right upper eyelid 10/08/2018   Blepharochalasis left upper eyelid 10/08/2018   Fatigue 01/31/2015   Former smoker 01/31/2015   Encounter for preventive health examination 01/31/2015   Symptomatic menopausal or female climacteric states 01/31/2015   B12 deficiency 12/29/2013   Diplopia 02/04/2013   Cerebral artery occlusion with cerebral infarction (HCC) 01/23/2013   Past Medical History:  Diagnosis Date   Allergy 12-29-11   doxycycline   Anxiety ?   Cataract 29-Dec-2018???   both removed   Depression December 28, 2016   husband passed away   GERD (gastroesophageal reflux disease) ?   Heart murmur 1959   2 month premature   Hypertension 1990s  ?   mid 30'S   Neuromuscular disorder (HCC) 1994   C5-6  then C6-7 2014?   Sleep apnea 2020?   very small   Stroke [facility]) 12/29/11   occipital lobe- balance/vision   Thyroid disease Dec 28, 2020??   underlying TSH   Past Surgical History:  Procedure Laterality Date   BREAST SURGERY  ?   tag put in   EYE SURGERY  2020/21   cataracts   LAMINECTOMY     SPINE SURGERY  1994   C5-6 laminectomy, ? 6-7 laminectomy/formanotomy   TUBAL LIGATION  ?   rods implanted   Social History   Tobacco Use   Smoking status: Former    Current packs/day: 0.00    Average packs/day: 0.5 packs/day for 30.0 years (15.0 ttl pk-yrs)    Types: Cigarettes    Quit date: 07/24/2007    Years  since quitting: 16.4   Smokeless tobacco: Never   Tobacco comments:    quitting was the only really challenging thing in my life.  6 quits  Substance Use Topics   Alcohol use: Yes    Alcohol/week: 3.0 standard drinks of alcohol    Types: 3 Shots of liquor per week    Comment: 1 x month   Drug use: Not Currently    Types: Marijuana    Comment: When I was in my teens and early 12/29/23 smoked marijauna.      ROS: as noted in HPI  Objective:     BP 136/72   Pulse 63   Ht 5' 6.5 (1.689 m)   Wt 163 lb (73.9 kg)   SpO2 97%   BMI 25.91 kg/m  BP Readings from Last 3 Encounters:  12/15/23 136/72  11/25/23 (!) 173/80  11/19/23 128/68   Wt Readings from Last 3 Encounters:  12/15/23 163 lb (73.9 kg)  11/25/23 165 lb (74.8 kg)  11/19/23 166 lb (75.3 kg)      Physical Exam   No results found for any visits on 12/15/23.  Last CBC Lab Results  Component Value Date   WBC 6.1 10/27/2023   HGB 13.6 10/27/2023   HCT 40.5 10/27/2023   MCV 94 10/27/2023   MCH 31.4 10/27/2023  RDW 12.4 10/27/2023   PLT 246 10/27/2023   Last metabolic panel Lab Results  Component Value Date   GLUCOSE 94 10/27/2023   NA 137 10/27/2023   K 4.4 10/27/2023   CL 98 10/27/2023   CO2 22 10/27/2023   BUN 12 10/27/2023   CREATININE 0.81 10/27/2023   EGFR 80 10/27/2023   CALCIUM 9.2 10/27/2023   PROT 6.4 10/27/2023   ALBUMIN 4.4 10/27/2023   LABGLOB 2.0 10/27/2023   BILITOT 0.4 10/27/2023   ALKPHOS 93 10/27/2023   AST 19 10/27/2023   ALT 14 10/27/2023   Last lipids Lab Results  Component Value Date   CHOL 203 (H) 10/27/2023   HDL 60 10/27/2023   LDLCALC 121 (H) 10/27/2023   TRIG 126 10/27/2023   CHOLHDL 3.4 10/27/2023   Last hemoglobin A1c Lab Results  Component Value Date   HGBA1C 5.9 (H) 10/27/2023   Last thyroid functions Lab Results  Component Value Date   TSH 2.330 10/27/2023   FREET4 1.33 10/27/2023   Last vitamin D  Lab Results  Component Value Date   VD25OH 42.9  10/27/2023   Last vitamin B12 and Folate Lab Results  Component Value Date   VITAMINB12 394 10/27/2023   FOLATE 12.7 10/27/2023      The ASCVD Risk score (Arnett DK, et al., 2019) failed to calculate for the following reasons:   Risk score cannot be calculated because patient has a medical history suggesting prior/existing ASCVD  Assessment & Plan:  There are no diagnoses linked to this encounter.   No follow-ups on file.   Benton LITTIE Gave, PA

## 2023-12-15 NOTE — Patient Instructions (Addendum)
 Take the muscle relaxer three times daily as needed. Keep in mind it may make you feel tired or drowsy, so do not operate machinery or drive a car until you know how it affects you.  Take the steroid pack in the morning with breakfast.  If your symptoms persist into next week, please perform the xray.  Do the exercises above - 10 reps daily.

## 2023-12-16 ENCOUNTER — Encounter: Payer: Self-pay | Admitting: Urgent Care

## 2023-12-24 NOTE — Telephone Encounter (Signed)
 12/24/23 sent mychart EE  12/24/23 pt will call back KS

## 2024-01-04 ENCOUNTER — Encounter: Payer: Self-pay | Admitting: Urgent Care

## 2024-01-05 MED ORDER — ALPRAZOLAM 0.5 MG PO TABS: 0.5000 mg | ORAL_TABLET | Freq: Three times a day (TID) | ORAL | 2 refills | Status: AC | PRN

## 2024-01-07 ENCOUNTER — Inpatient Hospital Stay (HOSPITAL_BASED_OUTPATIENT_CLINIC_OR_DEPARTMENT_OTHER): Admission: RE | Admit: 2024-01-07 | Discharge: 2024-01-07 | Attending: Urgent Care | Admitting: Urgent Care

## 2024-01-07 ENCOUNTER — Encounter (HOSPITAL_BASED_OUTPATIENT_CLINIC_OR_DEPARTMENT_OTHER): Payer: Self-pay

## 2024-01-07 DIAGNOSIS — Z1231 Encounter for screening mammogram for malignant neoplasm of breast: Secondary | ICD-10-CM | POA: Insufficient documentation

## 2024-01-07 MED ORDER — ZOLPIDEM TARTRATE 10 MG PO TABS: 10.0000 mg | ORAL_TABLET | Freq: Every day | ORAL | 0 refills | Status: AC

## 2024-01-12 ENCOUNTER — Ambulatory Visit: Payer: Self-pay | Admitting: Urgent Care

## 2024-01-20 ENCOUNTER — Ambulatory Visit

## 2024-01-20 ENCOUNTER — Encounter: Payer: Self-pay | Admitting: Urgent Care

## 2024-01-20 DIAGNOSIS — M8589 Other specified disorders of bone density and structure, multiple sites: Secondary | ICD-10-CM

## 2024-01-20 DIAGNOSIS — Z13820 Encounter for screening for osteoporosis: Secondary | ICD-10-CM

## 2024-01-20 DIAGNOSIS — G4733 Obstructive sleep apnea (adult) (pediatric): Secondary | ICD-10-CM

## 2024-01-22 ENCOUNTER — Ambulatory Visit: Payer: Self-pay | Admitting: Family Medicine

## 2024-01-22 ENCOUNTER — Other Ambulatory Visit: Payer: Self-pay | Admitting: Family Medicine

## 2024-01-22 DIAGNOSIS — M8580 Other specified disorders of bone density and structure, unspecified site: Secondary | ICD-10-CM | POA: Insufficient documentation

## 2024-01-22 DIAGNOSIS — M8588 Other specified disorders of bone density and structure, other site: Secondary | ICD-10-CM

## 2024-01-22 NOTE — Progress Notes (Signed)
 Hi Dorothy Ray, your bone density test shows a T-score of -2.0.  This is consistent with mildly thin bones otherwise known as osteopenia.   The current recommendation for osteopenia (mildly thin bones) treatment includes:   #1 calcium-total of 1200 mg of calcium daily.  If you eat a very calcium rich diet you may be able to obtain that without a supplement.  If not, then I recommend calcium 500 mg twice a day.  There are several products over-the-counter such as Caltrate D and Viactiv chews which are great options that contain calcium and vitamin D . #2 vitamin D -recommend 800 international units daily. #3 exercise-recommend 30 minutes of weightbearing exercise 3 days a week.  Resistance training ,such as doing bands and light weights, can be particularly helpful.

## 2024-01-25 MED ORDER — ZEPBOUND 7.5 MG/0.5ML ~~LOC~~ SOAJ: 7.5000 mg | SUBCUTANEOUS | 5 refills | Status: AC

## 2024-01-25 NOTE — Telephone Encounter (Signed)

## 2024-01-27 ENCOUNTER — Other Ambulatory Visit: Payer: Self-pay | Admitting: Medical Genetics

## 2024-02-01 ENCOUNTER — Ambulatory Visit: Admitting: Neurology

## 2024-02-01 DIAGNOSIS — E663 Overweight: Secondary | ICD-10-CM

## 2024-02-01 DIAGNOSIS — R634 Abnormal weight loss: Secondary | ICD-10-CM

## 2024-02-01 DIAGNOSIS — R351 Nocturia: Secondary | ICD-10-CM

## 2024-02-01 DIAGNOSIS — R0683 Snoring: Secondary | ICD-10-CM

## 2024-02-01 DIAGNOSIS — G4761 Periodic limb movement disorder: Secondary | ICD-10-CM

## 2024-02-01 DIAGNOSIS — G4700 Insomnia, unspecified: Secondary | ICD-10-CM

## 2024-02-01 DIAGNOSIS — Z8669 Personal history of other diseases of the nervous system and sense organs: Secondary | ICD-10-CM

## 2024-02-01 DIAGNOSIS — G4720 Circadian rhythm sleep disorder, unspecified type: Secondary | ICD-10-CM

## 2024-02-01 DIAGNOSIS — Z8673 Personal history of transient ischemic attack (TIA), and cerebral infarction without residual deficits: Secondary | ICD-10-CM

## 2024-02-02 ENCOUNTER — Encounter: Payer: Self-pay | Admitting: Urgent Care

## 2024-02-02 ENCOUNTER — Ambulatory Visit (INDEPENDENT_AMBULATORY_CARE_PROVIDER_SITE_OTHER): Admitting: Urgent Care

## 2024-02-02 VITALS — BP 131/74 | HR 62 | Ht 66.5 in | Wt 164.0 lb

## 2024-02-02 DIAGNOSIS — Z79899 Other long term (current) drug therapy: Secondary | ICD-10-CM | POA: Diagnosis not present

## 2024-02-02 DIAGNOSIS — I10 Essential (primary) hypertension: Secondary | ICD-10-CM

## 2024-02-02 DIAGNOSIS — Z0000 Encounter for general adult medical examination without abnormal findings: Secondary | ICD-10-CM | POA: Diagnosis not present

## 2024-02-02 DIAGNOSIS — E782 Mixed hyperlipidemia: Secondary | ICD-10-CM

## 2024-02-02 MED ORDER — OLMESARTAN MEDOXOMIL 40 MG PO TABS: 40.0000 mg | ORAL_TABLET | Freq: Every day | ORAL | 3 refills | Status: AC

## 2024-02-02 NOTE — Progress Notes (Unsigned)
 "  Annual Wellness Visit     Patient: Dorothy Ray, Female    DOB: [birth year], 69 y.o.   MRN: [id number]  Subjective  Chief Complaint  Patient presents with   Annual Exam    Dorothy Ray is a 69 y.o. female who presents today for her Annual Wellness Visit. She reports consuming a general diet. Moving around a good bit but not involved in the gym. She generally feels fairly well. She reports sleeping fairly well. She does have additional problems to discuss today.   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/2023   Vitamin D  deficiency 11/25/2023   Mixed hyperlipidemia 11/25/2023   Primary hypertension 10/27/2023   History of CVA (cerebrovascular accident) 10/27/2023   Hypothyroidism 10/27/2023   Mitral valve insufficiency 10/27/2023   Blepharochalasis right upper eyelid 10/08/2018   Blepharochalasis left upper eyelid 10/08/2018   Fatigue 01/31/2015   Former smoker 01/31/2015   Encounter for preventive health examination 01/31/2015   Symptomatic menopausal or female climacteric states 01/31/2015   B12 deficiency 12/29/2013   Diplopia 02/04/2013   Cerebral artery occlusion with cerebral infarction (HCC) 01/23/2013   Past Medical History:  Diagnosis Date   Allergy 02-24-2011   doxycycline   Anxiety ?   Cataract 02-23-2018???   both removed   Depression 2016-02-24   husband passed away   GERD (gastroesophageal reflux disease) ?   Heart murmur 1959   2 month premature   Hypertension 1990s  ?   mid 30'S   Neuromuscular disorder (HCC) 1994   C5-6  then C6-7 2014?   Sleep apnea 2020?   very small   Stroke [facility]) 2011/02/24   occipital lobe- balance/vision   Thyroid disease 02-24-2020??   underlying TSH   Past Surgical History:  Procedure Laterality Date   BREAST SURGERY  ?   tag put in   EYE  SURGERY  2020/21   cataracts   LAMINECTOMY     SPINE SURGERY  1994   C5-6 laminectomy, ? 6-7 laminectomy/formanotomy   TUBAL LIGATION  ?   rods implanted   Social History[1]    Medications: Show/hide medication list[2]  Allergies[3]  Patient Care Team: Lowella Benton CROME, [place] as PCP - General (Physician Assistant)  ROS Complete 12 point ROS performed with all pertinent positives listed in HPI     Objective  BP 131/74   Pulse 62   Ht 5' 6.5 (1.689 m)   Wt 164 lb (74.4 kg)   SpO2 99%   BMI 26.07 kg/m  BP Readings from Last 3 Encounters:  02/02/24 131/74  12/15/23 136/72  11/25/23 (!) 173/80   Wt Readings from Last 3 Encounters:  02/02/24 164 lb (74.4 kg)  12/15/23 163 lb (73.9 kg)  11/25/23 165 lb (74.8 kg)      Physical Exam Vitals and nursing note reviewed.  Constitutional:      General: She is not in acute distress.    Appearance: Normal appearance. She is not ill-appearing, toxic-appearing or diaphoretic.  HENT:     Head: Normocephalic and atraumatic.     Right Ear: Tympanic membrane, ear canal and external ear normal. There is no impacted cerumen.     Left Ear: Tympanic membrane, ear canal and external ear normal. There is no impacted cerumen.  Nose: Nose normal.     Mouth/Throat:     Mouth: Mucous membranes are moist.     Pharynx: Oropharynx is clear. No oropharyngeal exudate or posterior oropharyngeal erythema.  Eyes:     General: No scleral icterus.       Right eye: No discharge.        Left eye: No discharge.     Extraocular Movements: Extraocular movements intact.     Pupils: Pupils are equal, round, and reactive to light.  Neck:     Thyroid: No thyroid mass, thyromegaly or thyroid tenderness.  Cardiovascular:     Rate and Rhythm: Normal rate and regular rhythm.     Pulses: Normal pulses.     Heart sounds: No murmur heard. Pulmonary:     Effort: Pulmonary effort is normal. No respiratory distress.     Breath sounds: Normal breath sounds.  No stridor. No wheezing or rhonchi.  Abdominal:     General: Abdomen is flat. Bowel sounds are normal. There is no distension.     Palpations: Abdomen is soft. There is no mass.     Tenderness: There is no abdominal tenderness. There is no guarding.  Musculoskeletal:     Cervical back: Normal range of motion and neck supple. No rigidity or tenderness.     Right lower leg: No edema.     Left lower leg: No edema.  Lymphadenopathy:     Cervical: No cervical adenopathy.  Skin:    General: Skin is warm and dry.     Coloration: Skin is not jaundiced.     Findings: No bruising, erythema or rash.  Neurological:     General: No focal deficit present.     Mental Status: She is alert and oriented to person, place, and time.     Sensory: No sensory deficit.     Motor: No weakness.  Psychiatric:        Mood and Affect: Mood normal.        Behavior: Behavior normal.     Most recent depression screenings:    02/02/2024    1:26 PM  PHQ 2/9 Scores  PHQ - 2 Score 1  PHQ- 9 Score 5    Functional Status Activities of Daily Living (to include ambulation/medication): (Patient-Rptd) Independent Ambulation: (Patient-Rptd) Independent Home Management (perform basic housework or laundry): (Patient-Rptd) Independent  Fall Screening Falls in the past year?: 0 Number of falls in past year: 0 Was there an injury with Fall?: 0 Fall Risk Category Calculator: 0 Patient Fall Risk Level: Low Fall Risk  Fall Risk Patient at Risk for Falls Due to: No Fall Risks Fall risk Follow up: Falls evaluation completed    Vision/Hearing Screen: No results found.  Last CBC Lab Results  Component Value Date   WBC 6.1 10/27/2023   HGB 13.6 10/27/2023   HCT 40.5 10/27/2023   MCV 94 10/27/2023   MCH 31.4 10/27/2023   RDW 12.4 10/27/2023   PLT 246 10/27/2023   Last metabolic panel Lab Results  Component Value Date   GLUCOSE 94 10/27/2023   NA 137 10/27/2023   K 4.4 10/27/2023   CL 98 10/27/2023    CO2 22 10/27/2023   BUN 12 10/27/2023   CREATININE 0.81 10/27/2023   EGFR 80 10/27/2023   CALCIUM 9.2 10/27/2023   PROT 6.4 10/27/2023   ALBUMIN 4.4 10/27/2023   LABGLOB 2.0 10/27/2023   BILITOT 0.4 10/27/2023   ALKPHOS 93 10/27/2023   AST 19 10/27/2023   ALT  14 10/27/2023   Last thyroid functions Lab Results  Component Value Date   TSH 2.330 10/27/2023   FREET4 1.33 10/27/2023   Last vitamin D  Lab Results  Component Value Date   VD25OH 42.9 10/27/2023   Last vitamin B12 and Folate Lab Results  Component Value Date   VITAMINB12 542 02/02/2024   FOLATE 12.1 02/02/2024       Assessment & Plan   Annual wellness visit done today including the all of the following: Reviewed patient's Family and Medical History Reviewed and updated list of patient's medical providers Assessment of cognitive impairment was done Assessed patient's functional ability Established a written schedule for health screening services Health Risk Assessent Completed and Reviewed  Exercise Activities and Dietary recommendations  Goals      Achieve a Healthy Weight-Obesity     Timeframe:  Long-Range Goal Priority:  High Start Date:                             Expected End Date:                         - set goal weight    Why is this important?   When you are ready to manage your weight, have a plan and have set a goal, it is time to take action.  Taking small steps to change how you eat and exercise is a good place to start.    Notes:  GOAL WEIGHT 155#=     Activity and Exercise Increased     Evidence-based guidance:  Review current exercise levels.  Assess patient perspective on exercise or activity level, barriers to increasing activity, motivation and readiness for change.  Recommend or set healthy exercise goal based on individual tolerance.  Encourage small steps toward making change in amount of exercise or activity.  Urge reduction of sedentary activities or screen time.   Promote group activities within the community or with family or support person.  Consider referral to rehabiliation therapist for assessment and exercise/activity plan.   Notes:      Blood Pressure < 140/90     Maintain normal BP     Safe Sleep Environment Maintained     Evidence-based guidance:   Provide anticipatory guidance regarding risks of cosleeping and sudden unexplained infant death.  Encourage use of safe sleep practices including always placing the baby on back in own crib, bassinette or cradle in the parents' room.  Keep the crib or bassinette free of pillows, bumper pads, stuffed toys and blankets.  Promote alternatives to cosleeping such as room-sharing or cosleeping bassinettes that attach to the parents' bed.  Encourage pacifier use at sleep times after breastfeeding is firmly established or soon after birth in bottle-fed infants.    Notes:  Pt would like to achieve good, sound sleep all night        Immunization History  Administered Date(s) Administered   Influenza-Unspecified 01/28/2024   PNEUMOCOCCAL CONJUGATE-20 01/12/2024    Health Maintenance  Topic Date Due   Hepatitis C Screening  Never done   Zoster Vaccines- Shingrix (1 of 2) 02/06/2024 (Originally 10/20/1974)   COVID-19 Vaccine (1) 02/18/2024 (Originally 04/21/1956)   DTaP/Tdap/Td (1 - Tdap) 11/05/2024 (Originally 10/20/1974)   Medicare Annual Wellness (AWV)  11/04/2024   Mammogram  01/06/2026   Colonoscopy  12/25/2029   Pneumococcal Vaccine: 50+ Years  Completed   Influenza Vaccine  Completed  Bone Density Scan  Completed   Meningococcal B Vaccine  Aged Out     Discussed health benefits of physical activity, and encouraged her to engage in regular exercise appropriate for her age and condition.    Problem List Items Addressed This Visit       Cardiovascular and Mediastinum   Primary hypertension   Relevant Medications   olmesartan  (BENICAR ) 40 MG tablet     Other   Mixed  hyperlipidemia   Relevant Medications   olmesartan  (BENICAR ) 40 MG tablet   Other Visit Diagnoses       Routine adult health maintenance    -  Primary     Long-term use of high-risk medication       Relevant Orders   Lipid panel (Completed)   HgB A1c (Completed)   B12 and Folate Panel (Completed)      Assessment and Plan    Adult Wellness Visit Annual wellness visit completed. Blood pressure controlled without amlodipine . Discussed diet, exercise, and weight goal. Vaccinations reviewed. - Encourage increased physical activity and creative ways to incorporate exercise. - Maintain weight goal of 150 lbs. - Administer tetanus vaccination when available.  Insomnia Chronic insomnia with recent exacerbation. Doxepin  prescribed for sleep maintenance. No interaction with Mounjaro. - Start doxepin  3 mg by cutting 6 mg tablet in half. - Monitor sleep patterns and report effectiveness.  Primary hypertension Blood pressure controlled without amlodipine . Olmesartan  prescription issue resolved. - Continue monitoring blood pressure. - Called in olmesartan  40 mg prescription to [facility] mail order.  Mixed hyperlipidemia Lipid levels pending update with upcoming blood work.        Return in about 4 months (around 06/01/2024).     Benton LITTIE Gave, PA       [1]  Social History Tobacco Use   Smoking status: Former    Current packs/day: 0.00    Average packs/day: 0.5 packs/day for 30.0 years (15.0 ttl pk-yrs)    Types: Cigarettes    Quit date: 07/24/2007    Years since quitting: 16.5   Smokeless tobacco: Never   Tobacco comments:    quitting was the only really challenging thing in my life.  6 quits  Substance Use Topics   Alcohol use: Yes    Alcohol/week: 3.0 standard drinks of alcohol    Types: 3 Shots of liquor per week    Comment: 1 x month   Drug use: Not Currently    Types: Marijuana    Comment: When I was in my teens and early 20's smoked marijauna.  [2]  Outpatient  Medications Prior to Visit  Medication Sig   ALPRAZolam  (XANAX ) 0.5 MG tablet Take 1 tablet (0.5 mg total) by mouth 3 (three) times daily as needed for anxiety.   ascorbic acid (VITAMIN C) 500 MG tablet Take 500 mg by mouth.   Biotin 1000 MCG tablet Take 1,000 mcg by mouth.   carisoprodol  (SOMA ) 350 MG tablet Take 1 tablet (350 mg total) by mouth 3 (three) times daily as needed for muscle spasms.   Cholecalciferol 125 MCG (5000 UT) capsule Take 5,000 Units by mouth.   levothyroxine (SYNTHROID) 50 MCG tablet Take 50 mcg by mouth daily.   Multiple Vitamins-Minerals (PRESERVISION AREDS PO)    omeprazole (PRILOSEC) 40 MG capsule Take 1 capsule by mouth daily.   tirzepatide  (ZEPBOUND ) 7.5 MG/0.5ML Pen Inject 7.5 mg into the skin once a week.   triamcinolone cream (KENALOG) 0.1 % Apply topically.   zolpidem  (AMBIEN ) 10 MG tablet  Take 1 tablet (10 mg total) by mouth at bedtime.   [DISCONTINUED] amLODipine  (NORVASC ) 5 MG tablet Take 1 tablet (5 mg total) by mouth daily. (Patient not taking: Reported on 02/02/2024)   [DISCONTINUED] Coenzyme Q10 50 MG CAPS Take by mouth. (Patient not taking: Reported on 02/02/2024)   [DISCONTINUED] olmesartan  (BENICAR ) 40 MG tablet Take 40 mg by mouth daily.   [DISCONTINUED] Omega-3 Fatty Acids (FISH OIL) 1000 MG CAPS Take by mouth. (Patient not taking: Reported on 02/02/2024)   No facility-administered medications prior to visit.  [3]  Allergies Allergen Reactions   Doxycycline Nausea And Vomiting and Nausea Only   "

## 2024-02-02 NOTE — Patient Instructions (Signed)
 Please start doxepin  3mg  nightly.  Follow up in 4 months for routine

## 2024-02-02 NOTE — Procedures (Unsigned)
 Physician Interpretation: Please see link under Procedure Tab or under Encounters tab for physician report, technical report, as well as O2 titration and/or PAP titration tables (if applicable).

## 2024-02-03 ENCOUNTER — Ambulatory Visit: Payer: Self-pay | Admitting: Urgent Care

## 2024-02-03 LAB — LIPID PANEL
Chol/HDL Ratio: 3.8 ratio (ref 0.0–4.4)
Cholesterol, Total: 197 mg/dL (ref 100–199)
HDL: 52 mg/dL
LDL Chol Calc (NIH): 114 mg/dL — ABNORMAL HIGH (ref 0–99)
Triglycerides: 179 mg/dL — ABNORMAL HIGH (ref 0–149)
VLDL Cholesterol Cal: 31 mg/dL (ref 5–40)

## 2024-02-03 LAB — HEMOGLOBIN A1C
Est. average glucose Bld gHb Est-mCnc: 111 mg/dL
Hgb A1c MFr Bld: 5.5 % (ref 4.8–5.6)

## 2024-02-03 LAB — B12 AND FOLATE PANEL
Folate: 12.1 ng/mL
Vitamin B-12: 542 pg/mL (ref 232–1245)

## 2024-02-04 ENCOUNTER — Ambulatory Visit
Admission: RE | Admit: 2024-02-04 | Discharge: 2024-02-04 | Disposition: A | Discharge status: Home / Self Care | Payer: Self-pay | Source: Ambulatory Visit | Attending: Medical Genetics | Admitting: Medical Genetics

## 2024-02-04 ENCOUNTER — Other Ambulatory Visit (HOSPITAL_COMMUNITY): Payer: Self-pay

## 2024-02-04 ENCOUNTER — Ambulatory Visit: Payer: Self-pay | Admitting: Neurology

## 2024-02-04 ENCOUNTER — Telehealth: Payer: Self-pay

## 2024-02-04 DIAGNOSIS — Z006 Encounter for examination for normal comparison and control in clinical research program: Secondary | ICD-10-CM

## 2024-02-04 NOTE — Telephone Encounter (Signed)
 Pharmacy Patient Advocate Encounter   Received notification from Pt Calls Messages that prior authorization for Zepbound  7.5mg /0.42ml is required/requested.   Insurance verification completed.   The patient is insured through [employer].   Per test claim: PA required; PA started via CoverMyMeds. KEY [id number] . Please see clinical question(s) below that I am not finding the answer to in their chart and advise.     Is there a copy of the sleep study that was done?

## 2024-02-04 NOTE — Telephone Encounter (Signed)
 I sent a message to the sleep physican, I was able to pull up the results on her chart. I will keep you posted when I hear back

## 2024-02-05 NOTE — Telephone Encounter (Signed)
 Clinical questions answered and PA submitted.

## 2024-02-08 NOTE — Telephone Encounter (Signed)
 Pharmacy Patient Advocate Encounter  Received notification from [employer] that Prior Authorization for Zepbound  7.5mg /0.21ml has been DENIED.  See denial reason below. No denial letter attached in CMM. Will attach denial letter to Media tab once received.   PA #/Case ID/Reference #: [id number]

## 2024-02-11 ENCOUNTER — Encounter: Payer: Self-pay | Admitting: Urgent Care

## 2024-02-12 ENCOUNTER — Other Ambulatory Visit: Payer: Self-pay | Admitting: Urgent Care

## 2024-02-12 DIAGNOSIS — K219 Gastro-esophageal reflux disease without esophagitis: Secondary | ICD-10-CM

## 2024-02-12 LAB — GENECONNECT MOLECULAR SCREEN: Genetic Analysis Overall Interpretation: NEGATIVE

## 2024-02-12 MED ORDER — OMEPRAZOLE 40 MG PO CPDR: 40.0000 mg | DELAYED_RELEASE_CAPSULE | Freq: Every day | ORAL | 0 refills | Status: AC

## 2024-02-26 ENCOUNTER — Encounter: Payer: Self-pay | Admitting: Urgent Care

## 2024-02-26 MED ORDER — DOXEPIN HCL 6 MG PO TABS: 6.0000 mg | ORAL_TABLET | Freq: Every evening | ORAL | 2 refills | Status: AC

## 2024-02-26 NOTE — Telephone Encounter (Signed)
 Patient requesting  Doxepin  6mg   Showing as d/c on 01/06/2024 in patient chart.  Last written 11/17/2023 Upcoming appt 06/02/2024

## 2024-06-02 ENCOUNTER — Ambulatory Visit: Admitting: Urgent Care
